# Patient Record
Sex: Female | Born: 1935 | Race: White | Hispanic: Yes | Marital: Married | Smoking: Never smoker
Health system: Southern US, Community
[De-identification: ages and names within clinical notes are randomized; demographics above are authoritative.]

## PROBLEM LIST (undated history)

## (undated) DIAGNOSIS — I10 Essential (primary) hypertension: Secondary | ICD-10-CM

## (undated) DIAGNOSIS — R7303 Prediabetes: Secondary | ICD-10-CM

## (undated) DIAGNOSIS — K219 Gastro-esophageal reflux disease without esophagitis: Secondary | ICD-10-CM

## (undated) HISTORY — DX: Essential (primary) hypertension: I10

## (undated) HISTORY — DX: Prediabetes: R73.03

## (undated) HISTORY — DX: Gastro-esophageal reflux disease without esophagitis: K21.9

---

## 2002-11-02 ENCOUNTER — Emergency Department (HOSPITAL_COMMUNITY): Admission: EM | Admit: 2002-11-02 | Discharge: 2002-11-03 | Payer: Self-pay | Admitting: Emergency Medicine

## 2002-11-03 ENCOUNTER — Encounter: Payer: Self-pay | Admitting: Emergency Medicine

## 2004-04-10 ENCOUNTER — Ambulatory Visit: Payer: Self-pay | Admitting: Nurse Practitioner

## 2004-04-24 ENCOUNTER — Ambulatory Visit: Payer: Self-pay | Admitting: Nurse Practitioner

## 2004-05-02 ENCOUNTER — Ambulatory Visit: Payer: Self-pay | Admitting: *Deleted

## 2004-05-22 ENCOUNTER — Ambulatory Visit: Payer: Self-pay | Admitting: Nurse Practitioner

## 2004-06-26 ENCOUNTER — Ambulatory Visit: Payer: Self-pay | Admitting: Nurse Practitioner

## 2004-09-03 ENCOUNTER — Ambulatory Visit: Payer: Self-pay | Admitting: Nurse Practitioner

## 2005-01-31 ENCOUNTER — Ambulatory Visit: Payer: Self-pay | Admitting: Nurse Practitioner

## 2005-02-14 ENCOUNTER — Ambulatory Visit: Payer: Self-pay | Admitting: Nurse Practitioner

## 2005-06-18 ENCOUNTER — Ambulatory Visit: Payer: Self-pay | Admitting: Nurse Practitioner

## 2005-08-21 ENCOUNTER — Ambulatory Visit: Payer: Self-pay | Admitting: Nurse Practitioner

## 2005-08-23 ENCOUNTER — Ambulatory Visit (HOSPITAL_COMMUNITY): Admission: RE | Admit: 2005-08-23 | Discharge: 2005-08-23 | Payer: Self-pay | Admitting: Internal Medicine

## 2005-11-08 ENCOUNTER — Ambulatory Visit: Payer: Self-pay | Admitting: Nurse Practitioner

## 2005-12-13 ENCOUNTER — Ambulatory Visit: Payer: Self-pay | Admitting: Nurse Practitioner

## 2005-12-14 ENCOUNTER — Ambulatory Visit (HOSPITAL_COMMUNITY): Admission: RE | Admit: 2005-12-14 | Discharge: 2005-12-14 | Payer: Self-pay | Admitting: Internal Medicine

## 2006-03-21 ENCOUNTER — Ambulatory Visit: Payer: Self-pay | Admitting: Nurse Practitioner

## 2006-06-05 ENCOUNTER — Ambulatory Visit: Payer: Self-pay | Admitting: Nurse Practitioner

## 2006-07-14 IMAGING — CT CT HEAD WO/W CM
1 of 2 series · 13 of 30 positions shown, 17 images · IV contrast (omnipaque)
Comparison: None.

CLINICAL DATA: Right-sided headache. Increasingly blurred vision.

HEAD CT WITHOUT AND WITH CONTRAST
TECHNIQUE: 5mm collimated images were obtained from the base of the skull
through the vertex according to standard protocol before and after
administration of intravenous contrast.
Contrast:  100 cc Omnipaque 300

[Series 2: brain w/o 4.8 h45s st · axial · non-contrast · 0.42mm/px · z∈[-155,-39]mm · 13 of 30 slices shown, 17 images]
[im 3/30  brain]
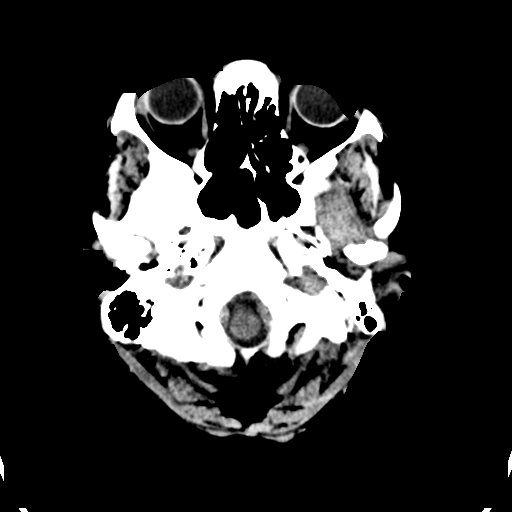
[im 3/30  bone]
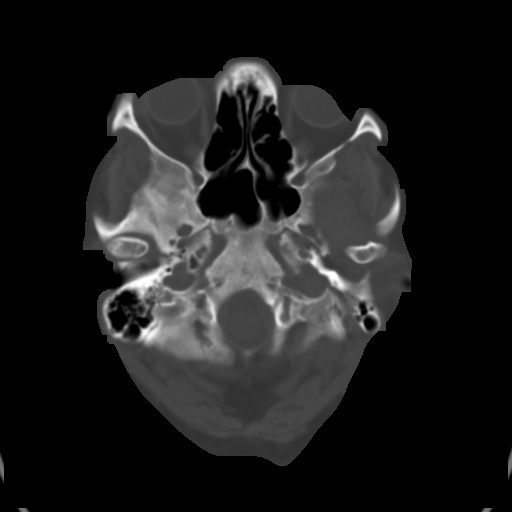
[im 5/30  brain]
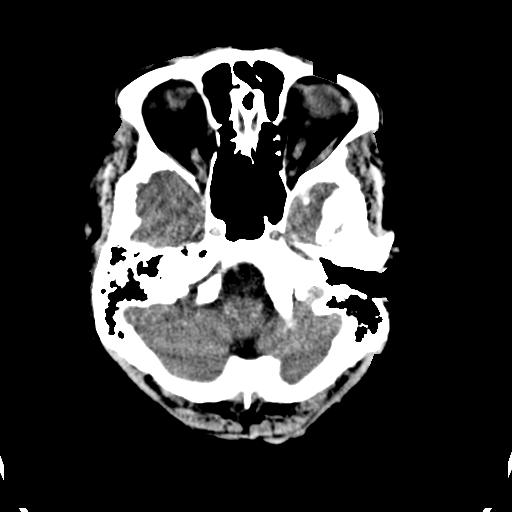
[im 7/30  brain]
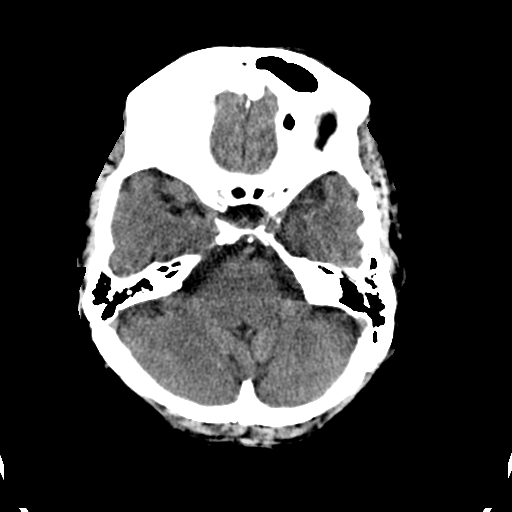
[im 9/30  brain]
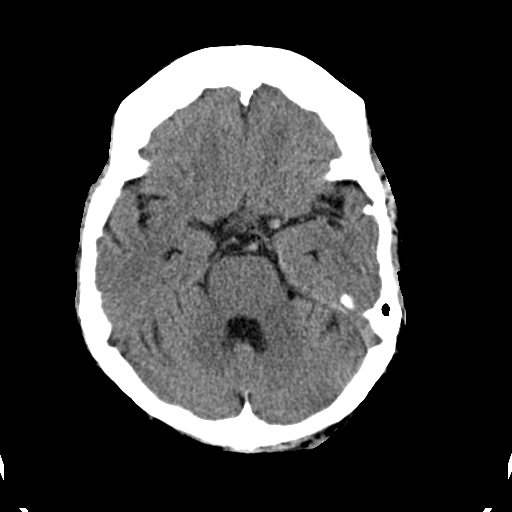
[im 11/30  brain]
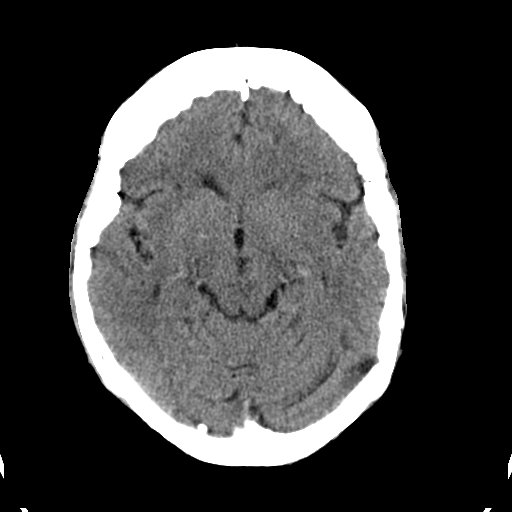
[im 11/30  bone]
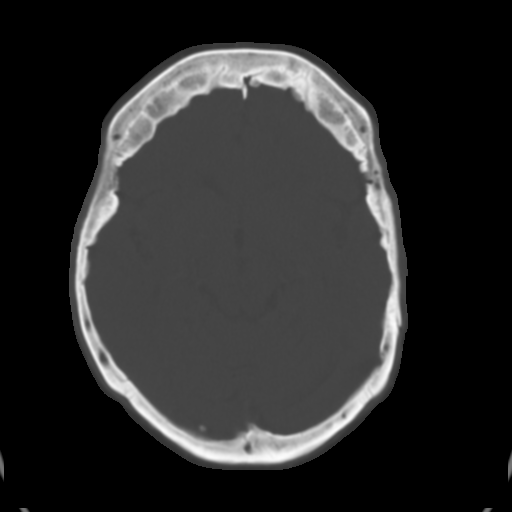
[im 13/30  brain]
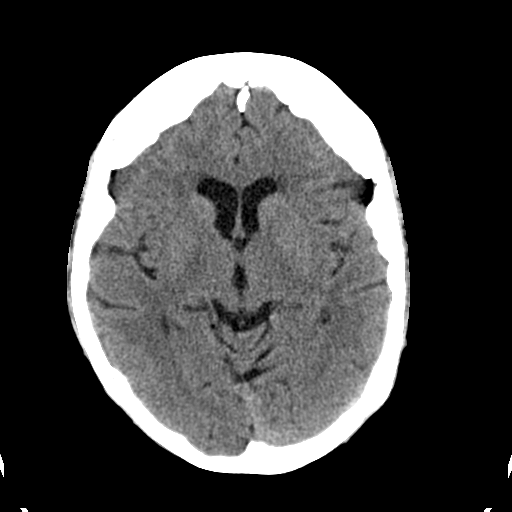
[im 15/30  brain]
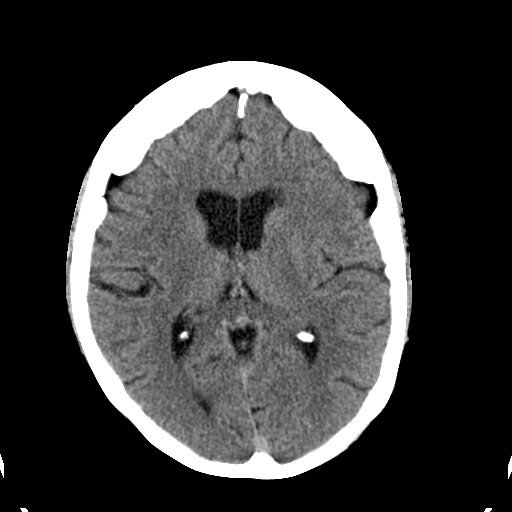
[im 17/30  brain]
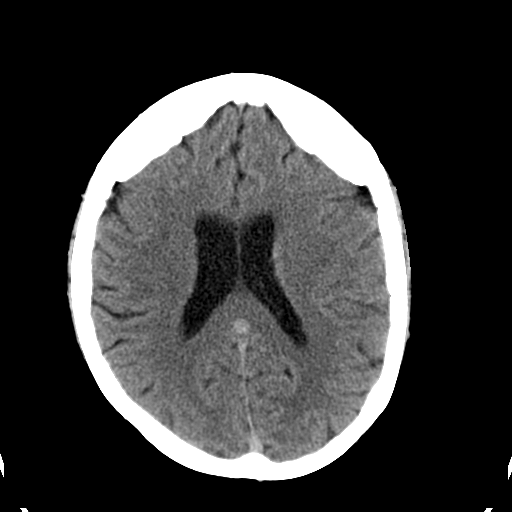
[im 19/30  brain]
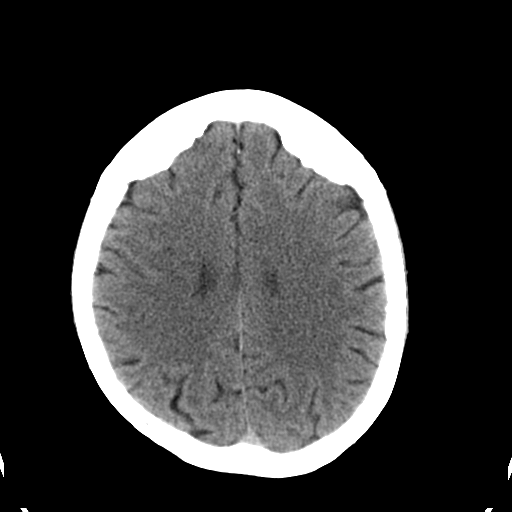
[im 19/30  bone]
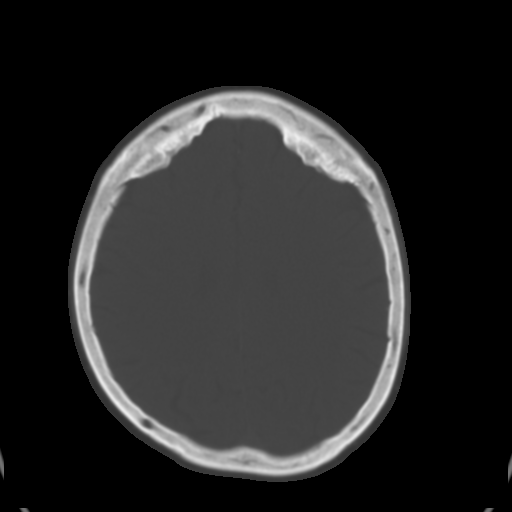
[im 21/30  brain]
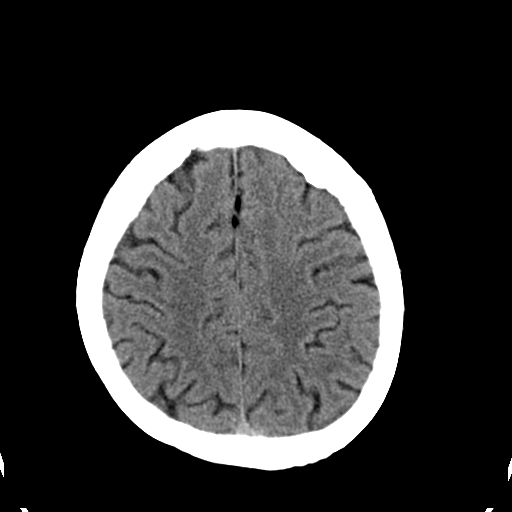
[im 23/30  brain]
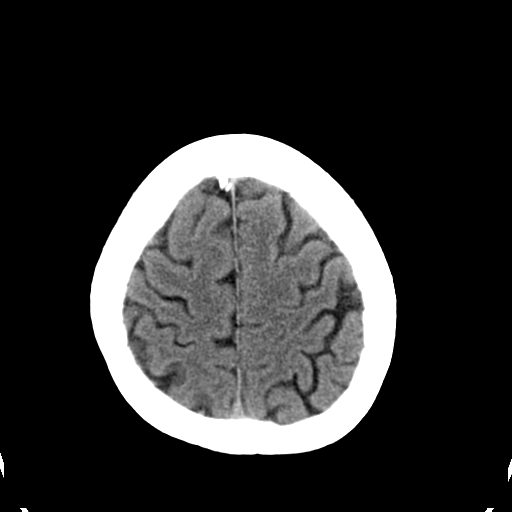
[im 25/30  brain]
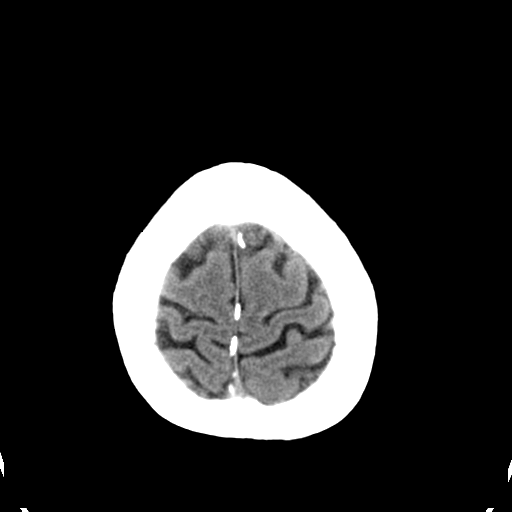
[im 27/30  brain]
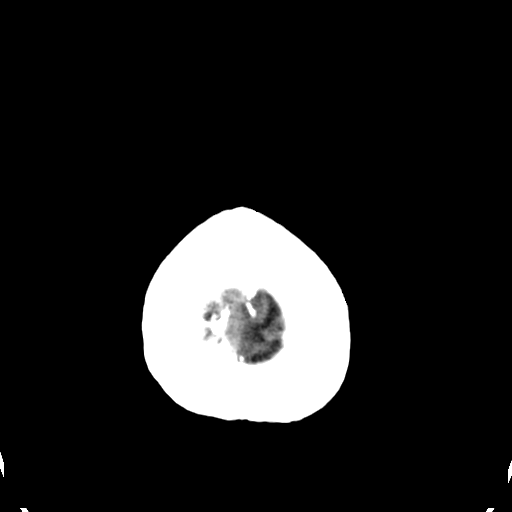
[im 27/30  bone]
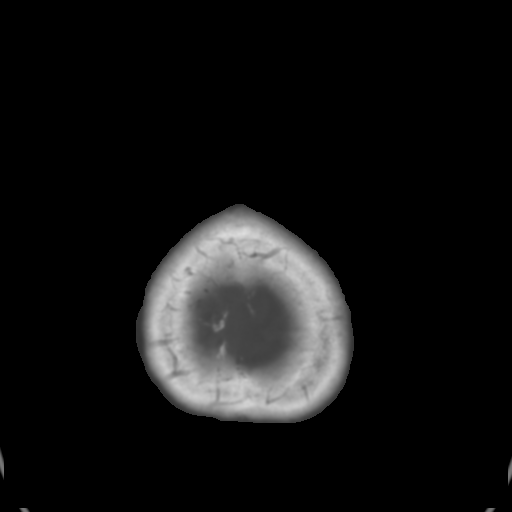

[13 of 30 positions shown; findings below may reference images not displayed]

FINDINGS: Minimally prominent ventricles and subarachnoid spaces. No
intracranial hemorrhage, mass-effect or abnormal enhancement. Bilateral
hyperostosis frontalis. The included portions of the paranasal sinuses are
normally pneumatized with a hypoplastic right frontal sinus noted.

IMPRESSION

Minimal diffuse cerebral and cerebellar atrophy. No acute abnormality.

## 2006-07-29 HISTORY — PX: CATARACT EXTRACTION: SUR2

## 2007-03-02 DIAGNOSIS — K219 Gastro-esophageal reflux disease without esophagitis: Secondary | ICD-10-CM

## 2007-03-02 DIAGNOSIS — I10 Essential (primary) hypertension: Secondary | ICD-10-CM

## 2007-03-25 ENCOUNTER — Encounter (INDEPENDENT_AMBULATORY_CARE_PROVIDER_SITE_OTHER): Payer: Self-pay | Admitting: Nurse Practitioner

## 2007-03-25 ENCOUNTER — Ambulatory Visit: Payer: Self-pay | Admitting: Internal Medicine

## 2007-03-25 LAB — CONVERTED CEMR LAB
AST: 34 units/L (ref 0–37)
Albumin: 4.2 g/dL (ref 3.5–5.2)
BUN: 15 mg/dL (ref 6–23)
Basophils Relative: 1 % (ref 0–1)
CO2: 27 meq/L (ref 19–32)
Calcium: 9.3 mg/dL (ref 8.4–10.5)
Chloride: 104 meq/L (ref 96–112)
Creatinine, Ser: 0.81 mg/dL (ref 0.40–1.20)
Glucose, Bld: 115 mg/dL — ABNORMAL HIGH (ref 70–99)
Hemoglobin: 13.5 g/dL (ref 12.0–15.0)
Lymphs Abs: 2.2 10*3/uL (ref 0.7–3.3)
MCHC: 32.6 g/dL (ref 30.0–36.0)
MCV: 94.5 fL (ref 78.0–100.0)
Monocytes Absolute: 0.5 10*3/uL (ref 0.2–0.7)
Monocytes Relative: 8 % (ref 3–11)
Neutro Abs: 3.1 10*3/uL (ref 1.7–7.7)
Neutrophils Relative %: 53 % (ref 43–77)
Potassium: 4.4 meq/L (ref 3.5–5.3)
RBC: 4.38 M/uL (ref 3.87–5.11)
TSH: 2.491 microintl units/mL (ref 0.350–5.50)
WBC: 5.9 10*3/uL (ref 4.0–10.5)

## 2007-04-07 ENCOUNTER — Ambulatory Visit (HOSPITAL_COMMUNITY): Admission: RE | Admit: 2007-04-07 | Discharge: 2007-04-07 | Payer: Self-pay | Admitting: Family Medicine

## 2007-04-15 ENCOUNTER — Encounter (INDEPENDENT_AMBULATORY_CARE_PROVIDER_SITE_OTHER): Payer: Self-pay | Admitting: *Deleted

## 2007-08-04 ENCOUNTER — Ambulatory Visit: Payer: Self-pay | Admitting: Internal Medicine

## 2008-04-25 ENCOUNTER — Ambulatory Visit: Payer: Self-pay | Admitting: Family Medicine

## 2008-05-03 ENCOUNTER — Ambulatory Visit: Payer: Self-pay | Admitting: Family Medicine

## 2008-05-03 LAB — CONVERTED CEMR LAB
Albumin: 4.3 g/dL (ref 3.5–5.2)
Alkaline Phosphatase: 135 units/L — ABNORMAL HIGH (ref 39–117)
BUN: 23 mg/dL (ref 6–23)
Basophils Relative: 1 % (ref 0–1)
Calcium: 9.7 mg/dL (ref 8.4–10.5)
Chloride: 103 meq/L (ref 96–112)
Glucose, Bld: 111 mg/dL — ABNORMAL HIGH (ref 70–99)
HDL: 53 mg/dL (ref >39)
Hemoglobin: 14.4 g/dL (ref 12.0–15.0)
LDL Cholesterol: 86 mg/dL (ref 0–99)
Lymphocytes Relative: 35 % (ref 12–46)
Lymphs Abs: 2 10*3/uL (ref 0.7–4.0)
MCHC: 32.4 g/dL (ref 30.0–36.0)
Monocytes Absolute: 0.4 10*3/uL (ref 0.1–1.0)
Monocytes Relative: 7 % (ref 3–12)
Neutro Abs: 3.2 10*3/uL (ref 1.7–7.7)
Neutrophils Relative %: 56 % (ref 43–77)
Potassium: 4.1 meq/L (ref 3.5–5.3)
RBC: 4.78 M/uL (ref 3.87–5.11)
Sodium: 142 meq/L (ref 135–145)
TSH: 1.874 microintl units/mL (ref 0.350–4.50)
Total Protein: 7.4 g/dL (ref 6.0–8.3)
Triglycerides: 146 mg/dL (ref <150)
WBC: 5.8 10*3/uL (ref 4.0–10.5)

## 2008-05-09 ENCOUNTER — Ambulatory Visit: Payer: Self-pay | Admitting: Internal Medicine

## 2009-01-14 ENCOUNTER — Emergency Department (HOSPITAL_COMMUNITY): Admission: EM | Admit: 2009-01-14 | Discharge: 2009-01-14 | Payer: Self-pay | Admitting: Emergency Medicine

## 2009-05-19 ENCOUNTER — Ambulatory Visit: Payer: Self-pay | Admitting: Internal Medicine

## 2009-06-01 ENCOUNTER — Ambulatory Visit: Payer: Self-pay | Admitting: Internal Medicine

## 2009-06-13 ENCOUNTER — Ambulatory Visit: Payer: Self-pay | Admitting: Internal Medicine

## 2009-06-13 ENCOUNTER — Encounter: Payer: Self-pay | Admitting: Family Medicine

## 2009-06-13 LAB — CONVERTED CEMR LAB
ALT: 24 units/L (ref 0–35)
Albumin: 4.3 g/dL (ref 3.5–5.2)
CO2: 27 meq/L (ref 19–32)
Calcium: 9.2 mg/dL (ref 8.4–10.5)
Chloride: 105 meq/L (ref 96–112)
Cholesterol: 169 mg/dL (ref 0–200)
Glucose, Bld: 90 mg/dL (ref 70–99)
Hemoglobin: 14 g/dL (ref 12.0–15.0)
Lymphs Abs: 2.1 10*3/uL (ref 0.7–4.0)
MCV: 95 fL (ref 78.0–100.0)
Monocytes Absolute: 0.5 10*3/uL (ref 0.1–1.0)
Monocytes Relative: 7 % (ref 3–12)
Neutro Abs: 3.6 10*3/uL (ref 1.7–7.7)
Neutrophils Relative %: 57 % (ref 43–77)
RBC: 4.59 M/uL (ref 3.87–5.11)
Sodium: 145 meq/L (ref 135–145)
Total Protein: 7.2 g/dL (ref 6.0–8.3)
VLDL: 23 mg/dL (ref 0–40)
WBC: 6.2 10*3/uL (ref 4.0–10.5)

## 2009-06-19 ENCOUNTER — Ambulatory Visit: Payer: Self-pay | Admitting: Internal Medicine

## 2009-08-18 ENCOUNTER — Ambulatory Visit: Payer: Self-pay | Admitting: Family Medicine

## 2009-09-07 ENCOUNTER — Ambulatory Visit: Payer: Self-pay | Admitting: Internal Medicine

## 2009-10-16 ENCOUNTER — Ambulatory Visit: Payer: Self-pay | Admitting: Internal Medicine

## 2009-10-16 LAB — CONVERTED CEMR LAB
Hgb A1c MFr Bld: 6.3 % — ABNORMAL HIGH (ref 4.6–6.1)
TSH: 1.382 microintl units/mL (ref 0.350–4.500)

## 2009-11-06 ENCOUNTER — Ambulatory Visit: Payer: Self-pay | Admitting: Internal Medicine

## 2009-11-06 LAB — CONVERTED CEMR LAB
Chloride: 104 meq/L (ref 96–112)
Creatinine, Ser: 0.8 mg/dL (ref 0.40–1.20)
Potassium: 4 meq/L (ref 3.5–5.3)
Sodium: 142 meq/L (ref 135–145)

## 2009-12-15 ENCOUNTER — Ambulatory Visit: Payer: Self-pay | Admitting: Internal Medicine

## 2010-02-13 ENCOUNTER — Ambulatory Visit: Payer: Self-pay | Admitting: Internal Medicine

## 2010-02-13 LAB — CONVERTED CEMR LAB
BUN: 24 mg/dL — ABNORMAL HIGH (ref 6–23)
Chloride: 104 meq/L (ref 96–112)
HDL: 51 mg/dL (ref >39)
LDL Cholesterol: 91 mg/dL (ref 0–99)
Potassium: 4.9 meq/L (ref 3.5–5.3)
Sodium: 142 meq/L (ref 135–145)
Total CHOL/HDL Ratio: 3.3
Triglycerides: 132 mg/dL (ref <150)
VLDL: 26 mg/dL (ref 0–40)

## 2010-03-08 ENCOUNTER — Ambulatory Visit: Payer: Self-pay | Admitting: Internal Medicine

## 2010-06-11 ENCOUNTER — Encounter (INDEPENDENT_AMBULATORY_CARE_PROVIDER_SITE_OTHER): Payer: Self-pay | Admitting: *Deleted

## 2010-06-11 ENCOUNTER — Ambulatory Visit: Payer: Self-pay | Admitting: Internal Medicine

## 2010-06-11 LAB — CONVERTED CEMR LAB
BUN: 23 mg/dL (ref 6–23)
Calcium: 9.5 mg/dL (ref 8.4–10.5)
Creatinine, Ser: 0.93 mg/dL (ref 0.40–1.20)
Hgb A1c MFr Bld: 6.1 % — ABNORMAL HIGH (ref <5.7)
Magnesium: 2.2 mg/dL (ref 1.5–2.5)

## 2011-07-30 DIAGNOSIS — R7303 Prediabetes: Secondary | ICD-10-CM | POA: Insufficient documentation

## 2011-07-30 HISTORY — DX: Prediabetes: R73.03

## 2013-10-12 ENCOUNTER — Ambulatory Visit: Payer: Self-pay | Attending: Internal Medicine | Admitting: Internal Medicine

## 2013-10-12 ENCOUNTER — Encounter: Payer: Self-pay | Admitting: Internal Medicine

## 2013-10-12 VITALS — BP 160/90 | HR 78 | Temp 98.3°F | Resp 16 | Wt 163.8 lb

## 2013-10-12 DIAGNOSIS — Z008 Encounter for other general examination: Secondary | ICD-10-CM | POA: Insufficient documentation

## 2013-10-12 DIAGNOSIS — Z139 Encounter for screening, unspecified: Secondary | ICD-10-CM

## 2013-10-12 DIAGNOSIS — R208 Other disturbances of skin sensation: Secondary | ICD-10-CM | POA: Insufficient documentation

## 2013-10-12 DIAGNOSIS — R209 Unspecified disturbances of skin sensation: Secondary | ICD-10-CM | POA: Insufficient documentation

## 2013-10-12 DIAGNOSIS — M25569 Pain in unspecified knee: Secondary | ICD-10-CM | POA: Insufficient documentation

## 2013-10-12 DIAGNOSIS — M25562 Pain in left knee: Secondary | ICD-10-CM

## 2013-10-12 DIAGNOSIS — Z79899 Other long term (current) drug therapy: Secondary | ICD-10-CM | POA: Insufficient documentation

## 2013-10-12 DIAGNOSIS — I10 Essential (primary) hypertension: Secondary | ICD-10-CM | POA: Insufficient documentation

## 2013-10-12 DIAGNOSIS — K219 Gastro-esophageal reflux disease without esophagitis: Secondary | ICD-10-CM | POA: Insufficient documentation

## 2013-10-12 DIAGNOSIS — M25561 Pain in right knee: Secondary | ICD-10-CM | POA: Insufficient documentation

## 2013-10-12 LAB — CBC WITH DIFFERENTIAL/PLATELET
BASOS ABS: 0.1 10*3/uL (ref 0.0–0.1)
Basophils Relative: 1 % (ref 0–1)
EOS PCT: 1 % (ref 0–5)
Eosinophils Absolute: 0.1 10*3/uL (ref 0.0–0.7)
HCT: 40.5 % (ref 36.0–46.0)
Hemoglobin: 13.6 g/dL (ref 12.0–15.0)
LYMPHS PCT: 25 % (ref 12–46)
Lymphs Abs: 1.3 10*3/uL (ref 0.7–4.0)
MCH: 30.1 pg (ref 26.0–34.0)
MCHC: 33.6 g/dL (ref 30.0–36.0)
MCV: 89.6 fL (ref 78.0–100.0)
Monocytes Absolute: 0.4 10*3/uL (ref 0.1–1.0)
Monocytes Relative: 7 % (ref 3–12)
Neutro Abs: 3.5 10*3/uL (ref 1.7–7.7)
Neutrophils Relative %: 66 % (ref 43–77)
PLATELETS: 211 10*3/uL (ref 150–400)
RBC: 4.52 MIL/uL (ref 3.87–5.11)
RDW: 14.6 % (ref 11.5–15.5)
WBC: 5.3 10*3/uL (ref 4.0–10.5)

## 2013-10-12 LAB — COMPLETE METABOLIC PANEL WITH GFR
ALBUMIN: 3.8 g/dL (ref 3.5–5.2)
ALT: 19 U/L (ref 0–35)
AST: 28 U/L (ref 0–37)
Alkaline Phosphatase: 95 U/L (ref 39–117)
BUN: 22 mg/dL (ref 6–23)
CALCIUM: 9.3 mg/dL (ref 8.4–10.5)
CHLORIDE: 102 meq/L (ref 96–112)
CO2: 27 meq/L (ref 19–32)
Creat: 1.04 mg/dL (ref 0.50–1.10)
GFR, EST AFRICAN AMERICAN: 57 mL/min — AB
GFR, EST NON AFRICAN AMERICAN: 50 mL/min — AB
GLUCOSE: 134 mg/dL — AB (ref 70–99)
POTASSIUM: 4.3 meq/L (ref 3.5–5.3)
Sodium: 139 mEq/L (ref 135–145)
Total Bilirubin: 0.8 mg/dL (ref 0.2–1.2)
Total Protein: 6.8 g/dL (ref 6.0–8.3)

## 2013-10-12 MED ORDER — AMLODIPINE BESYLATE 2.5 MG PO TABS: 2.5000 mg | ORAL_TABLET | Freq: Every day | ORAL | Status: DC

## 2013-10-12 MED ORDER — GABAPENTIN 100 MG PO CAPS: 100.0000 mg | ORAL_CAPSULE | Freq: Every day | ORAL | Status: DC

## 2013-10-12 NOTE — Progress Notes (Signed)
Patient here to establish care Takes medication for blood pressure 

## 2013-10-12 NOTE — Progress Notes (Signed)
Patient Demographics  Holly Connersueblito Flaming, is a 80 y.o. female  ZOX:[id number]  [id number]  DOB - [birth year]  CC:  Chief Complaint  Patient presents with  . Establish Care       HPI: Holly Anderson is a 80 y.o. female here today to establish medical care. Patient has history of hypertension, GERD, chronic bilateral knee pain she is accompanied with her family members, as per patient she is compliant taking her medication today her blood pressure is elevated to 160/90 manually , she also reported to have feeling numbness and burning sensation in the feet especially worse at night, for the knee pain she has been taking naproxen. Patient has No headache, No chest pain, No abdominal pain - No Nausea, No new weakness tingling or numbness, No Cough - SOB.  No Known Allergies Past Medical History  Diagnosis Date  . Hypertension   . GERD (gastroesophageal reflux disease)    No current outpatient prescriptions on file prior to visit.   No current facility-administered medications on file prior to visit.   Family History  Problem Relation Age of Onset  . Hypertension Mother    History   Social History  . Marital Status: Married    Spouse Name: N/A    Number of Children: N/A  . Years of Education: N/A   Occupational History  . Not on file.   Social History Main Topics  . Smoking status: Never Smoker   . Smokeless tobacco: Not on file  . Alcohol Use: No  . Drug Use: Not on file  . Sexual Activity: Not on file   Other Topics Concern  . Not on file   Social History Narrative  . No narrative on file    Review of Systems: Constitutional: Negative for fever, chills, diaphoresis, activity change, appetite change and fatigue. HENT: Negative for ear pain, nosebleeds, congestion, facial swelling, rhinorrhea, neck pain, neck stiffness and ear discharge.  Eyes: Negative for pain, discharge, redness, itching and visual disturbance. Respiratory: Negative for cough, choking,  chest tightness, shortness of breath, wheezing and stridor.  Cardiovascular: Negative for chest pain, palpitations and leg swelling. Gastrointestinal: Negative for abdominal distention. Genitourinary: Negative for dysuria, urgency, frequency, hematuria, flank pain, decreased urine volume, difficulty urinating and dyspareunia.  Musculoskeletal: Negative for back pain, joint swelling, arthralgia and gait problem. Neurological: Negative for dizziness, tremors, seizures, syncope, facial asymmetry, speech difficulty, weakness, light-headedness, numbness and headaches.  Hematological: Negative for adenopathy. Does not bruise/bleed easily. Psychiatric/Behavioral: Negative for hallucinations, behavioral problems, confusion, dysphoric mood, decreased concentration and agitation.    Objective:   Filed Vitals:   10/12/13 1154  BP: 160/90  Pulse:   Temp:   Resp:     Physical Exam: Constitutional: Patient appears well-developed and well-nourished. No distress. HENT: Normocephalic, atraumatic, External right and left ear normal. Oropharynx is clear and moist.  Eyes: Conjunctivae and EOM are normal. PERRLA, no scleral icterus. Neck: Normal ROM. Neck supple. No JVD. No tracheal deviation. No thyromegaly. CVS: RRR, S1/S2 +, no murmurs, no gallops, no carotid bruit.  Pulmonary: Effort and breath sounds normal, no stridor, rhonchi, wheezes, rales.  Musculoskeletal: Normal range of motion. Right knee tenderness and crepitation positive  Neuro: Alert. Normal reflexes, muscle tone coordination. No cranial nerve deficit. Skin: Skin is warm and dry. No rash noted. Not diaphoretic. No erythema. No pallor. Psychiatric: Normal mood and affect. Behavior, judgment, thought content normal.  Lab Results  Component Value Date   WBC 6.2 06/13/2009   HGB 14.0  06/13/2009   HCT 43.6 06/13/2009   MCV 95.0 06/13/2009   PLT 233 06/13/2009   Lab Results  Component Value Date   CREATININE 0.93 06/11/2010   BUN 23  06/11/2010   NA 145 06/11/2010   K 5.0 06/11/2010   CL 106 06/11/2010   CO2 27 06/11/2010    Lab Results  Component Value Date   HGBA1C 6.1* 06/11/2010   Lipid Panel     Component Value Date/Time   CHOL 168 02/13/2010 2125   TRIG 132 02/13/2010 2125   HDL 51 02/13/2010 2125   CHOLHDL 3.3 Ratio 02/13/2010 2125   VLDL 26 02/13/2010 2125   LDLCALC 91 02/13/2010 2125       Assessment and plan:   1. HYPERTENSION Advised for DASH diet, continue with her lisinopril/hydrochlorothiazide, I have added Norvasc 2.5 mg. - COMPLETE METABOLIC PANEL WITH GFR - amLODipine (NORVASC) 2.5 MG tablet; Take 1 tablet (2.5 mg total) by mouth daily.  Dispense: 90 tablet; Refill: 3  2. GERD Continue with Prilosec.  3. Screening Baseline blood work - CBC with Differential - Vit D  25 hydroxy (rtn osteoporosis monitoring) - TSH  5. Bilateral knee pain Taking naproxen when necessary for pain. - Ambulatory referral to Orthopedic Surgery  6. Burning sensation of feet Patient will try Neurontin each bedtime - gabapentin (NEURONTIN) 100 MG capsule; Take 1 capsule (100 mg total) by mouth at bedtime.  Dispense: 90 capsule; Refill: 1 - Will check Vitamin B12   Return in about 6 weeks (around 11/23/2013).   Doris Cheadle, MD

## 2013-10-12 NOTE — Patient Instructions (Signed)
La dieta DASH  (DASH Diet) La dieta DASH (por su nombre en ingls) significa "Abordaje diettico para detener la hipertensin". Es un plan de alimentacin saludable que ha demostrado reducir la presin arterial elevada (hipertensin) en tan solo 14 das, mientras probablemente tambin ofrezca otros beneficios significativos para la salud. Estos otros beneficios incluyen la reduccin del riesgo de sufrir cncer de mama despus de la menopausia y de sufrir diabetes tipo 2, enfermedades cardacas, cncer de colon e ictus. Los beneficios para la salud tambin incluyen la prdida de peso y la disminucin del riesgo de insuficiencia renal en pacientes con enfermedad renal crnica.  GUAS PARA LA DIETA   Limite la cantidad de sal (sodio). Su dieta debe contener menos de 1500 mg de sodio por da.  Limite el consumo de carbohidratos refinados o procesados. Su dieta debe incluir principalmente granos enteros. Consuma postres con moderacin y limite el agregado de azcar.  Incluya pequeas cantidades de grasas saludables para el corazn. Estos tipos de grasas estn contenidas en las nueces, aceites y margarina. Limite las grasas saturadas y trans. Estas grasas han demostrado ser perjudiciales para el organismo. ELECCIN DE LOS ALIMENTOS  Los siguientes grupos de alimentos se basan en una dieta de 2000 caloras. Consulte a su nutricionista matriculado cules son sus necesidades calricas individuales.  Granos y productos elaborados con granos (6 a 8 porciones por da).   Consuma ms a menudo: Pan integral, arroz integral, pasta de trigo o de harina integral, quinoa, palomitas de maz sin grasa o sal aadida (infladas).  Consuma con menos frecuencia:  Pan blanco, pastas blancas, arroz blanco, pan de maz. Verduras (4 a 5 porciones por da).   Consuma ms a menudo: Hortalizas frescas, congeladas y enlatadas. Puede consumir las hortalizas crudas, al vapor, asadas o grilladas con una mnima cantidad de  grasas.  Consuma con menos frecuencia o evite: Vegetales con crema o fritos. Verduras en salsa de queso. Frutas (4 a 5 porciones por da).  Consuma ms a menudo: Frutas frescas, en conserva (en su jugo natural) o frutas congeladas. Frutas secas sin el agregado de azcar. Cien por ciento jugo de fruta ( taza [237 ml] por da).  Consuma con menos frecuencia: Frutas secas con azcar agregado. Fruta enlatada en almbar light o espeso. Carnes magras, pescado y aves de corral (2 porciones o menos por da. Una porcin es de 3 a 4 oz. [85-114 g]).  Consuma ms a menudo: Carne molida 90% magra o ms de lomo o solomillo . Cortes redondos de carne, pechuga de pollo y de pavo. Todos los pescados. Prepare la carne al horno o asada a la parrilla o al grill. Nada debe ser frito.  Consuma con menos frecuencia o evite: Cortes grasos de carne, pavo o pata, muslo o ala de pollo. Cortes de carne o pescado fritos. Lcteos (2 a 3 porciones)  Consuma ms a menudo: La leche descremada o con bajo contenido de grasa, yogur descremado o semidescremado, queso con bajo contenido en grasa o parcialmente descremado.  Consuma con menos frecuencia o evite: Leche (entera, al 2%). Yogur de leche entera. Quesos con toda su grasa. Nueces, semillas y legumbres (4 a 5 porciones por semana).  Consuma ms a menudo: Todos los alimentos sin sal agregada.  Consuma con menos frecuencia o evite: Nueces y semillas con sal, frijoles enlatados con sal agregada. Grasas y dulces (limitados)  Consuma ms a menudo: Aceites vegetales, margarinas en pote sin grasas trans, gelatina sin azcar. Mayonesa y aderezos para ensaladas.  Consuma   con menos frecuencia o evite: Aceites de coco, aceites de palma, mantequilla, margarina en barra, crema, mitad leche y mitad crema, galletas, dulces, pastel. PARA OBTENER MS INFORMACIN  El Dash Diet Eating Plan (Plan Nutricional Dieta Dash): www.dashdiet.org  Document Released: 07/04/2011 Document  Revised: 10/07/2011 ExitCare Patient Information 2014 [employer].  

## 2013-10-13 LAB — VITAMIN D 25 HYDROXY (VIT D DEFICIENCY, FRACTURES): Vit D, 25-Hydroxy: 30 ng/mL (ref 30–89)

## 2013-10-13 LAB — VITAMIN B12: VITAMIN B 12: 536 pg/mL (ref 211–911)

## 2013-10-13 LAB — TSH: TSH: 2.035 u[IU]/mL (ref 0.350–4.500)

## 2013-10-14 ENCOUNTER — Telehealth: Payer: Self-pay | Admitting: Emergency Medicine

## 2013-10-14 ENCOUNTER — Telehealth: Payer: Self-pay | Admitting: *Deleted

## 2013-10-14 NOTE — Telephone Encounter (Signed)
Message copied by SMITH, JILL Darlis Loan on Thu Oct 14, 2013  4:09 PM ------      Message from: Doris CheadleADVANI, DEEPAK      Created: Wed Oct 13, 2013 12:46 PM       Blood work reviewed noticed impaired fasting glucose, call and advise patient for low carbohydrate diet. Will check hemoglobin A1c on the next visit.       ------

## 2013-10-14 NOTE — Telephone Encounter (Signed)
Message copied by Raynelle CharyWINFREE, Sarie Stall R on Thu Oct 14, 2013  2:12 PM ------      Message from: Doris CheadleADVANI, DEEPAK      Created: Wed Oct 13, 2013 12:46 PM       Blood work reviewed noticed impaired fasting glucose, call and advise patient for low carbohydrate diet. Will check hemoglobin A1c on the next visit.       ------

## 2013-10-14 NOTE — Telephone Encounter (Signed)
Pt given lab results via [employer] spanish language Pt verbalized understanding

## 2013-10-14 NOTE — Telephone Encounter (Signed)
Left message 1st attempt.  

## 2013-10-28 ENCOUNTER — Ambulatory Visit: Payer: Self-pay

## 2013-11-01 ENCOUNTER — Ambulatory Visit: Payer: Self-pay | Admitting: Family Medicine

## 2013-11-19 ENCOUNTER — Ambulatory Visit: Payer: Self-pay | Admitting: Internal Medicine

## 2015-07-17 ENCOUNTER — Encounter: Payer: Self-pay | Admitting: Internal Medicine

## 2015-12-21 ENCOUNTER — Other Ambulatory Visit: Payer: Self-pay | Admitting: Internal Medicine

## 2016-01-23 ENCOUNTER — Other Ambulatory Visit: Payer: Self-pay | Admitting: Internal Medicine

## 2016-03-13 ENCOUNTER — Other Ambulatory Visit: Payer: Self-pay | Admitting: Internal Medicine

## 2016-03-14 ENCOUNTER — Telehealth: Payer: Self-pay | Admitting: Internal Medicine

## 2016-03-14 MED ORDER — LISINOPRIL-HYDROCHLOROTHIAZIDE 20-25 MG PO TABS: 1.0000 | ORAL_TABLET | Freq: Every day | ORAL | 1 refills | Status: DC

## 2016-03-14 MED ORDER — OMEPRAZOLE 40 MG PO CPDR
40.0000 mg | DELAYED_RELEASE_CAPSULE | Freq: Every day | ORAL | 11 refills | Status: DC
Start: 2016-03-14 — End: 2016-04-26

## 2016-03-14 MED ORDER — NAPROXEN 500 MG PO TABS: 500.0000 mg | ORAL_TABLET | Freq: Two times a day (BID) | ORAL | 11 refills | Status: DC

## 2016-03-14 NOTE — Telephone Encounter (Signed)
Patient came in to request medication refill for medications lisinopril-hydrochlorothiazide 20-25 MG per tablet , Omeprazole 40 MG capsule, naproxen  500 MG tablet. Please change the pharmacy in patient's chart [facility]  on [address], [place], [place] [address]  Phone: [phone]  Patient scheduled to come in for CPE 04/26/16 at 9:30 AM

## 2016-03-14 NOTE — Telephone Encounter (Signed)
Appointment for CPE scheduled and [facility] [address].

## 2016-03-14 NOTE — Telephone Encounter (Signed)
Patient had been out of her medications for a week now.

## 2016-04-26 ENCOUNTER — Ambulatory Visit (INDEPENDENT_AMBULATORY_CARE_PROVIDER_SITE_OTHER): Payer: Self-pay | Admitting: Internal Medicine

## 2016-04-26 ENCOUNTER — Encounter: Payer: Self-pay | Admitting: Internal Medicine

## 2016-04-26 VITALS — BP 164/70 | HR 72 | Temp 98.0°F | Resp 16 | Ht <= 58 in | Wt 151.0 lb

## 2016-04-26 DIAGNOSIS — I10 Essential (primary) hypertension: Secondary | ICD-10-CM

## 2016-04-26 DIAGNOSIS — R208 Other disturbances of skin sensation: Secondary | ICD-10-CM

## 2016-04-26 DIAGNOSIS — Z0000 Encounter for general adult medical examination without abnormal findings: Secondary | ICD-10-CM

## 2016-04-26 MED ORDER — OMEPRAZOLE 40 MG PO CPDR: 40.0000 mg | DELAYED_RELEASE_CAPSULE | Freq: Every day | ORAL | 11 refills | Status: DC

## 2016-04-26 MED ORDER — AMLODIPINE BESYLATE 2.5 MG PO TABS: 2.5000 mg | ORAL_TABLET | Freq: Every day | ORAL | 3 refills | Status: DC

## 2016-04-26 MED ORDER — LISINOPRIL-HYDROCHLOROTHIAZIDE 20-25 MG PO TABS
1.0000 | ORAL_TABLET | Freq: Every day | ORAL | 1 refills | Status: DC
Start: 2016-04-26 — End: 2016-04-26

## 2016-04-26 MED ORDER — LISINOPRIL-HYDROCHLOROTHIAZIDE 20-25 MG PO TABS: 1.0000 | ORAL_TABLET | Freq: Every day | ORAL | 11 refills | Status: DC

## 2016-04-26 MED ORDER — GABAPENTIN 100 MG PO CAPS: 100.0000 mg | ORAL_CAPSULE | Freq: Every day | ORAL | 11 refills | Status: DC

## 2016-04-26 MED ORDER — NAPROXEN 500 MG PO TABS: 500.0000 mg | ORAL_TABLET | Freq: Two times a day (BID) | ORAL | 11 refills | Status: DC

## 2016-04-26 MED ORDER — AMLODIPINE BESYLATE 2.5 MG PO TABS: 2.5000 mg | ORAL_TABLET | Freq: Every day | ORAL | 11 refills | Status: DC

## 2016-04-26 MED ORDER — GABAPENTIN 100 MG PO CAPS: 100.0000 mg | ORAL_CAPSULE | Freq: Every day | ORAL | 1 refills | Status: DC

## 2016-04-26 NOTE — Progress Notes (Signed)
Subjective:    Patient ID: Holly Anderson, female    DOB: [birth year], 82 y.o.   MRN: [id number]  HPI   CPE  Daughter interpreting today  1.  Pap:  Cannot remember when had last pap, but always normal in past.  2.  Mammogram:  Cannot recall if she has had a mammogram.  Not interested in having one done at this point in her life    3.  Osteoprevention: 2 cups 1-3 servings of dairy daily:  Milk, cheese, yogurt.  Does use  Stationary bike 30 minutes daily.  4.  Guaiac Cards:  Never  5.  Colonoscopy:  Never--not interested in having this done.  6.  Immunizations:  Immunization History  Administered Date(s) Administered  . Influenza Whole 06/05/2006   No flu vaccine yet this year as never had a pneumovax  Essential Hypertension:  Ran out of Amlodipine in March.  Still taking Lisinopril HCTZ. Current Meds  Medication Sig  . lisinopril-hydrochlorothiazide (PRINZIDE,ZESTORETIC) 20-25 MG tablet Take 1 tablet by mouth daily.  . naproxen (NAPROSYN) 500 MG tablet Take 1 tablet (500 mg total) by mouth 2 (two) times daily with a meal.  . omeprazole (PRILOSEC) 40 MG capsule Take 1 capsule (40 mg total) by mouth daily.   No Known Allergies   Past Medical History:  Diagnosis Date  . GERD (gastroesophageal reflux disease)   . Hypertension   . Prediabetes    Past Surgical History:  Procedure Laterality Date  . CATARACT EXTRACTION Right 2008   Family History  Problem Relation Age of Onset  . Hypertension Mother    Social History   Social History  . Marital status: Married    Spouse name: Alden BenjaminFernando Martiarena  . Number of children: 5  . Years of education: 1   Occupational History  . house cleaning in the past--retired    Social History Main Topics  . Smoking status: Never Smoker  . Smokeless tobacco: Never Used  . Alcohol use No  . Drug use: No  . Sexual activity: Not Currently   Other Topics Concern  . Not on file   Social History Narrative   Originally from [place]    [place]   Lives at home with her 2 daughters and one of daughter's grandsons   Her husband lives in [place]--he is 622 years old (2017)   .   Review of Systems  Constitutional: Negative for fatigue.  HENT: Positive for hearing loss.        Not interested in having evaluated--not interested in hearing aides  Respiratory: Negative for shortness of breath.   Cardiovascular: Positive for leg swelling. Negative for chest pain.       Legs are dependent throughout the day.    Genitourinary: Negative for dysuria and urgency.       1 coke every day--makes her urinate every day  Musculoskeletal: Positive for arthralgias.       Knees hurt and keep her up at night.  Not taking Gabapentin for that  Neurological:       Memory:  Forgets cooking utensils in the cooler.  Forgets she has things cooking on the stove.  Psychiatric/Behavioral: Negative for dysphoric mood. The patient is not nervous/anxious.        Objective:   Physical Exam  Constitutional: She is oriented to person, place, and time. She appears well-developed and well-nourished.  HENT:  Head: Normocephalic and atraumatic.  Right Ear: Hearing, tympanic membrane, external ear and ear canal normal.  Left Ear: Hearing, tympanic membrane, external ear and ear canal normal.  Nose: Nose normal.  Mouth/Throat: Uvula is midline, oropharynx is clear and moist and mucous membranes are normal. Abnormal dentition.  Teeth missing.  Receding gumline  Eyes: Conjunctivae and EOM are normal. Pupils are equal, round, and reactive to light.  Not able to see discs well  Neck: Normal range of motion. Neck supple. No thyromegaly present.  Cardiovascular: Normal rate, regular rhythm, S1 normal and S2 normal.  PMI is not displaced.  Exam reveals no S3, no S4 and no friction rub.   No murmur heard. No carotid bruits.  Carotid, radial, femoral, DP and PT pulses normal and equal.   Pulmonary/Chest: Effort normal and breath sounds normal. Right  breast exhibits no inverted nipple, no mass, no nipple discharge, no skin change and no tenderness. Left breast exhibits no inverted nipple, no mass, no nipple discharge, no skin change and no tenderness.  Abdominal: Soft. Bowel sounds are normal. She exhibits no mass. There is no hepatosplenomegaly. There is no tenderness. No hernia.  Genitourinary:  Genitourinary Comments: Patient requested no examination.  Musculoskeletal: Normal range of motion.  Lymphadenopathy:       Head (right side): No submental and no submandibular adenopathy present.       Head (left side): No submental and no submandibular adenopathy present.    She has no cervical adenopathy.    She has no axillary adenopathy.       Right: No inguinal and no supraclavicular adenopathy present.       Left: No inguinal and no supraclavicular adenopathy present.  Neurological: She is alert and oriented to person, place, and time. She has normal strength and normal reflexes. No cranial nerve deficit or sensory deficit. Coordination and gait normal.  Skin: Skin is warm and dry.  Psychiatric: She has a normal mood and affect. Her speech is normal and behavior is normal. Judgment and thought content normal.          Assessment & Plan:  1.  CPE [facility] as gratis  2.  Memory:  Will perform MMSE when follows up for hypertension after back on meds.  3.  Essential hypertension: Missing amlodipine for some time--restart. Will do fasting blood work when returns for BP, pneumovax as well.

## 2016-06-01 ENCOUNTER — Encounter: Payer: Self-pay | Admitting: Internal Medicine

## 2016-06-11 ENCOUNTER — Ambulatory Visit (INDEPENDENT_AMBULATORY_CARE_PROVIDER_SITE_OTHER): Payer: Self-pay | Admitting: Internal Medicine

## 2016-06-11 ENCOUNTER — Other Ambulatory Visit: Payer: Self-pay | Admitting: Internal Medicine

## 2016-06-11 ENCOUNTER — Encounter: Payer: Self-pay | Admitting: Internal Medicine

## 2016-06-11 VITALS — BP 160/82 | HR 74 | Resp 12 | Ht <= 58 in | Wt 147.0 lb

## 2016-06-11 DIAGNOSIS — R7303 Prediabetes: Secondary | ICD-10-CM

## 2016-06-11 DIAGNOSIS — F0390 Unspecified dementia without behavioral disturbance: Secondary | ICD-10-CM

## 2016-06-11 DIAGNOSIS — Z13220 Encounter for screening for lipoid disorders: Secondary | ICD-10-CM

## 2016-06-11 DIAGNOSIS — I10 Essential (primary) hypertension: Secondary | ICD-10-CM

## 2016-06-11 MED ORDER — DONEPEZIL HCL 5 MG PO TABS: 5.0000 mg | ORAL_TABLET | Freq: Every day | ORAL | 11 refills | Status: DC

## 2016-06-11 NOTE — Progress Notes (Signed)
Subjective:    Patient ID: Holly Anderson, female    DOB: Nov 15, 1927, 82 y.o.   MRN: [id number]  HPI  1.  Essential Hypertension:  Meds all filled 04/26/2016.  Amlodipine had reportedly not been filled since March, so should not have had any extra around at home.  Has at least 21 tabs from 04/26/2016 left in bottle, so in past 6 weeks, has only taken 9 days worth of medication.    2.  Concern for memory:  Patient recognizes loss of memory.  States maybe 2 years has noted a loss of memory.  Daughter states she has noted problems with memory for 1 year, but has also noted in past 6 months that her mother is hearing things--voices or screaming at the window, knocking at door.  Generally occurs at night when she and daughter are in bed or when she is alone during the day.  No visual hallucinations. Patient has also lost her hearing.  Family feels her difficulty hearing things and "fills in"  Information when she cannot hear adequately.  Was evaluated 2-3 years ago, but could not afford hearing aides. Daughter feels her mother's memory loss has been gradual, patient feels the same. Daughter states she has left things cooking on stove until burned.  Patient states forgets her prayers and has to start over. Appears she is forgetting meds frequently as noted above. She states she forgets why she is getting items. [facility] and walk back without feeling lost. No urinary incontinence other then urge incontinence, but she knows she has to go before cannot make it to bathroom. No ataxic gait--not seen at CPE as well.  3.  Prediabetes:  Still drinking daily small soda.  Fair diet.  Walks around the backyard.  Current Meds  Medication Sig  . amLODipine (NORVASC) 2.5 MG tablet Take 1 tablet (2.5 mg total) by mouth daily.  Marland Kitchen. lisinopril-hydrochlorothiazide (PRINZIDE,ZESTORETIC) 20-25 MG tablet Take 1 tablet by mouth daily.  . naproxen (NAPROSYN) 500 MG tablet Take 1 tablet (500 mg  total) by mouth 2 (two) times daily with a meal.  . omeprazole (PRILOSEC) 40 MG capsule Take 1 capsule (40 mg total) by mouth daily.    No Known Allergies   Past Medical History:  Diagnosis Date  . GERD (gastroesophageal reflux disease)   . Hypertension   . Prediabetes 2013    Past Surgical History:  Procedure Laterality Date  . CATARACT EXTRACTION Right 2008   Family History  Problem Relation Age of Onset  . Hypertension Mother    Social History   Social History  . Marital status: Married    Spouse name: Alden BenjaminFernando Martiarena  . Number of children: 5  . Years of education: 1   Occupational History  . house cleaning in the past--retired    Social History Main Topics  . Smoking status: Never Smoker  . Smokeless tobacco: Never Used  . Alcohol use No  . Drug use: No  . Sexual activity: Not Currently   Other Topics Concern  . Not on file   Social History Narrative   Originally from [place]   [place]   Lives at home with her 2 daughters and one of daughter's grandsons   Her husband lives in [place]--he is 80 years old (2017)       Review of Systems     Objective:   Physical Exam   NAD HEENT:  PERRL, EOMI, dental loss and decay, throat  without injection Neck:  Supple, no adenopathy, no thyromegaly Chest:  CTA CV: RRR with normal S1 and S2, No S3, S4 or murmur.  Radial and DP pulses normal and equal Abd:  S, NT, No HSM or mass, + BS LE: trace edema  MMSE - Mini Mental State Exam 06/11/2016  Orientation to time 3  Orientation to Place 1  Registration 3  Attention/ Calculation 2  Recall 2  Language- name 2 objects 2  Language- repeat 1  Language- follow 3 step command 3  Language- read & follow direction 1  Write a sentence 0  Copy design 0  Total score 18       Assessment & Plan:  1.  Essential Hypertension:  Family to set up calendar with 2 pill boxes--one for as needed meds and one for BP meds and follow up behind her to be sure she  is taking meds.  2.  Moderate dementia:  Check CMP,TSH, CBC, today as well.  Start Aricept.    3.  Prediabetes:  A1C, FLP

## 2016-06-12 LAB — COMPREHENSIVE METABOLIC PANEL
A/G RATIO: 1.5 (ref 1.2–2.2)
ALBUMIN: 4.4 g/dL (ref 3.5–4.7)
ALT: 12 IU/L (ref 0–32)
AST: 24 IU/L (ref 0–40)
Alkaline Phosphatase: 93 IU/L (ref 39–117)
BUN/Creatinine Ratio: 23 (ref 12–28)
BUN: 25 mg/dL (ref 8–27)
Bilirubin Total: 0.6 mg/dL (ref 0.0–1.2)
CALCIUM: 9.6 mg/dL (ref 8.7–10.3)
CO2: 24 mmol/L (ref 18–29)
CREATININE: 1.11 mg/dL — AB (ref 0.57–1.00)
Chloride: 99 mmol/L (ref 96–106)
GFR, EST AFRICAN AMERICAN: 52 mL/min/{1.73_m2} — AB (ref >59)
GFR, EST NON AFRICAN AMERICAN: 45 mL/min/{1.73_m2} — AB (ref >59)
GLOBULIN, TOTAL: 3 g/dL (ref 1.5–4.5)
Glucose: 112 mg/dL — ABNORMAL HIGH (ref 65–99)
POTASSIUM: 4.8 mmol/L (ref 3.5–5.2)
SODIUM: 140 mmol/L (ref 134–144)
TOTAL PROTEIN: 7.4 g/dL (ref 6.0–8.5)

## 2016-06-12 LAB — CBC WITH DIFFERENTIAL/PLATELET
BASOS: 1 %
Basophils Absolute: 0 10*3/uL (ref 0.0–0.2)
EOS (ABSOLUTE): 0.1 10*3/uL (ref 0.0–0.4)
EOS: 1 %
HEMATOCRIT: 39.2 % (ref 34.0–46.6)
HEMOGLOBIN: 13 g/dL (ref 11.1–15.9)
IMMATURE GRANS (ABS): 0 10*3/uL (ref 0.0–0.1)
IMMATURE GRANULOCYTES: 0 %
LYMPHS: 32 %
Lymphocytes Absolute: 1.6 10*3/uL (ref 0.7–3.1)
MCH: 29.4 pg (ref 26.6–33.0)
MCHC: 33.2 g/dL (ref 31.5–35.7)
MCV: 89 fL (ref 79–97)
MONOCYTES: 7 %
MONOS ABS: 0.4 10*3/uL (ref 0.1–0.9)
NEUTROS PCT: 59 %
Neutrophils Absolute: 3.1 10*3/uL (ref 1.4–7.0)
Platelets: 199 10*3/uL (ref 150–379)
RBC: 4.42 x10E6/uL (ref 3.77–5.28)
RDW: 15.2 % (ref 12.3–15.4)
WBC: 5.2 10*3/uL (ref 3.4–10.8)

## 2016-06-12 LAB — LIPID PANEL W/O CHOL/HDL RATIO
Cholesterol, Total: 176 mg/dL (ref 100–199)
HDL: 58 mg/dL (ref >39)
LDL CALC: 88 mg/dL (ref 0–99)
TRIGLYCERIDES: 150 mg/dL — AB (ref 0–149)
VLDL Cholesterol Cal: 30 mg/dL (ref 5–40)

## 2016-06-12 LAB — TSH: TSH: 1.89 u[IU]/mL (ref 0.450–4.500)

## 2016-06-12 LAB — HGB A1C W/O EAG: HEMOGLOBIN A1C: 5.8 % — AB (ref 4.8–5.6)

## 2016-08-04 ENCOUNTER — Encounter: Payer: Self-pay | Admitting: Internal Medicine

## 2016-08-12 ENCOUNTER — Ambulatory Visit: Payer: Self-pay | Admitting: Internal Medicine

## 2016-08-24 ENCOUNTER — Encounter: Payer: Self-pay | Admitting: Internal Medicine

## 2016-09-18 ENCOUNTER — Encounter: Payer: Self-pay | Admitting: Internal Medicine

## 2016-09-18 ENCOUNTER — Ambulatory Visit (INDEPENDENT_AMBULATORY_CARE_PROVIDER_SITE_OTHER): Payer: Self-pay | Admitting: Internal Medicine

## 2016-09-18 DIAGNOSIS — F0390 Unspecified dementia without behavioral disturbance: Secondary | ICD-10-CM

## 2016-09-18 MED ORDER — DONEPEZIL HCL 5 MG PO TABS: 5.0000 mg | ORAL_TABLET | Freq: Every day | ORAL | 11 refills | Status: DC

## 2016-09-18 NOTE — Progress Notes (Signed)
   Subjective:    Patient ID: Holly Anderson, female    DOB: 05-30-1928, 83 y.o.   MRN: [id number]  HPI   1.  Dementia:  Did not pick up Aricept as pharmacy did not have.  Did not call.   She moved to live with her son in mid January, who lives away from a busy street.  Her daughter feels the voices she was hearing before may have been from living on [address] and hearing people and cars going by.  She is no longer hearing voices since the move.   Hard to get information adequately, but sounds like no change with memory. Pillbox was set up with her daily meds.  Daughter is not sure how often she has to be reminded to take her medication.  Patient states she is good about taking her pills.    2.  Prediabetes:  Having one soda daily.  A1C was at 5.8%.  Not really doing anything physically active.  Daughter states her brother has removed all other sugar out of the house.  3.  Essential Hypertension:  Is taking bp meds now.    4.  Fell and hit back of head in bathtub.  Did not have non slip stickers on bathtub floor, nor a handle.  Did not lose consciousness.  Has been acting okay since.  Current Meds  Medication Sig  . amLODipine (NORVASC) 2.5 MG tablet Take 1 tablet (2.5 mg total) by mouth daily.  Marland Kitchen. gabapentin (NEURONTIN) 100 MG capsule Take 1 capsule (100 mg total) by mouth at bedtime.  Marland Kitchen. lisinopril-hydrochlorothiazide (PRINZIDE,ZESTORETIC) 20-25 MG tablet Take 1 tablet by mouth daily.  . naproxen (NAPROSYN) 500 MG tablet Take 1 tablet (500 mg total) by mouth 2 (two) times daily with a meal.  . omeprazole (PRILOSEC) 40 MG capsule Take 1 capsule (40 mg total) by mouth daily.    No Known Allergies    .     Review of Systems     Objective:   Physical Exam NAD HEENT: mild swelling at left nuchal ridge.  No obvious bruising.  No break in skin.  PERRL, EOMI,  Neck:  Supple, No adenopathy Chest:  CTA CV:  RRR with normal S1 and S2, No S3, S4 or murmur, radial pulses normal and  equal Neuro:  A & O x2 (baseline), CN II-XII grossly intact, gait normal and motor 5/5       Assessment & Plan:  1.  Dementia:  Found Good Rx coupon for $4 at [employer], so will send Donepezil there.  Discussed to call clinic in future if unable to get medication.  2.  Prediabetes:  To continue to work on lifestyle changes.  Not clear she is moving adequately in a good direction.  3.  Essential Hypertension:  Much improved taking medication  4.  Closed head injury:  No concerning findings via history or physical exam.  To call if develops new mental status changes.

## 2016-09-18 NOTE — Patient Instructions (Signed)

## 2016-10-08 ENCOUNTER — Telehealth: Payer: Self-pay | Admitting: Internal Medicine

## 2016-10-08 NOTE — Telephone Encounter (Signed)
Spoke with son. Informed patient has refills left on medication. States her orange card has expired and health department will not accept expired card so patient has to pay out of pocket for medication. Patient may need a Rx called into [facility]. Son will call back and let us know if [facility]

## 2016-10-08 NOTE — Telephone Encounter (Signed)
Patient needs refill for amLODipine (NORVASC) 2.5 mg tablet and lisinopriol-hydrochlorothiazide (PRINZIDE,ZESTORETIC) 20-25 mg tablet.  Son stated hat mom is out of all of the patient's Rx.  Would like a refill on all so that they can pick up all at one time.

## 2016-10-10 ENCOUNTER — Other Ambulatory Visit: Payer: Self-pay

## 2016-10-10 DIAGNOSIS — R208 Other disturbances of skin sensation: Secondary | ICD-10-CM

## 2016-10-10 DIAGNOSIS — I10 Essential (primary) hypertension: Secondary | ICD-10-CM

## 2016-10-10 MED ORDER — LISINOPRIL-HYDROCHLOROTHIAZIDE 20-25 MG PO TABS: 1.0000 | ORAL_TABLET | Freq: Every day | ORAL | 11 refills | Status: DC

## 2016-10-10 MED ORDER — GABAPENTIN 100 MG PO CAPS: 100.0000 mg | ORAL_CAPSULE | Freq: Every day | ORAL | 11 refills | Status: DC

## 2016-10-10 MED ORDER — AMLODIPINE BESYLATE 2.5 MG PO TABS: 2.5000 mg | ORAL_TABLET | Freq: Every day | ORAL | 11 refills | Status: DC

## 2016-10-10 MED ORDER — NAPROXEN 500 MG PO TABS: 500.0000 mg | ORAL_TABLET | Freq: Two times a day (BID) | ORAL | 11 refills | Status: DC

## 2016-10-10 MED ORDER — OMEPRAZOLE 40 MG PO CPDR: 40.0000 mg | DELAYED_RELEASE_CAPSULE | Freq: Every day | ORAL | 11 refills | Status: DC

## 2016-11-18 ENCOUNTER — Ambulatory Visit (INDEPENDENT_AMBULATORY_CARE_PROVIDER_SITE_OTHER): Payer: Self-pay | Admitting: Internal Medicine

## 2016-11-18 ENCOUNTER — Encounter: Payer: Self-pay | Admitting: Internal Medicine

## 2016-11-18 VITALS — BP 138/60 | HR 68 | Resp 12 | Ht <= 58 in | Wt 150.0 lb

## 2016-11-18 DIAGNOSIS — M25561 Pain in right knee: Secondary | ICD-10-CM

## 2016-11-18 DIAGNOSIS — R7303 Prediabetes: Secondary | ICD-10-CM

## 2016-11-18 DIAGNOSIS — F0390 Unspecified dementia without behavioral disturbance: Secondary | ICD-10-CM

## 2016-11-18 DIAGNOSIS — M25562 Pain in left knee: Secondary | ICD-10-CM

## 2016-11-18 DIAGNOSIS — M542 Cervicalgia: Secondary | ICD-10-CM

## 2016-11-18 DIAGNOSIS — H5711 Ocular pain, right eye: Secondary | ICD-10-CM

## 2016-11-18 DIAGNOSIS — G8929 Other chronic pain: Secondary | ICD-10-CM

## 2016-11-18 DIAGNOSIS — I10 Essential (primary) hypertension: Secondary | ICD-10-CM

## 2016-11-18 NOTE — Progress Notes (Signed)
   Subjective:    Patient ID: Holly Anderson, female    DOB: Dec 11, 1927, 83 y.o.   MRN: [id number]  HPI   Daughter, Charlott Rakes, interprets today.  1.  Dementia: Did not bring meds today.  Aida's sister follows up behind to make sure their mother is taking her meds.  Pill box set up.   Aida feels her mother's memory has stabilized.   Discussed she will still have fluctuance with memory.  2.  Pinprick like right eye pain about 1 week ago.  Lasted for 15 minutes.  Placed ice on her eyes and resolved.  Last year, was seen by eye doctor.  Not clear where. Eye pain has not recurred.  3. Essential Hypertension:  Taking medication regularly.    4.  Prediabetes:  A1C in November was 5.8%.  Not physically active.  Drinking Coke at least once daily.  Exercise bike pedal broke, so stopped bing physically active.    5.  End of visit:  Brings up headaches--points to left neck and retroauricular area/nuchal area.       Current Meds  Medication Sig  . amLODipine (NORVASC) 2.5 MG tablet Take 1 tablet (2.5 mg total) by mouth daily.  Marland Kitchen donepezil (ARICEPT) 5 MG tablet Take 1 tablet (5 mg total) by mouth at bedtime.  . gabapentin (NEURONTIN) 100 MG capsule Take 1 capsule (100 mg total) by mouth at bedtime.  Marland Kitchen lisinopril-hydrochlorothiazide (PRINZIDE,ZESTORETIC) 20-25 MG tablet Take 1 tablet by mouth daily.  . naproxen (NAPROSYN) 500 MG tablet Take 1 tablet (500 mg total) by mouth 2 (two) times daily with a meal.  . omeprazole (PRILOSEC) 40 MG capsule Take 1 capsule (40 mg total) by mouth daily.    No Known Allergies  Review of Systems     Objective:   Physical Exam  NAD HEENT:  PERRL, EOMI, dense cataract on left.  Unable to visualize disc well on right.  Temporal arteries soft/pliant with good pulses bilaterally, nontender. Neck:  Very tight bilaterally over traps, somewhat tender and continues up left neck No adenopathy Chest:  CTA CV:  RRR with normal S1 and S2, Grade II/VI SEM heard best at right  2nd interspace and into carotids bilaterally.  Carotid, radial and DP pulses normal and Equal. Abd:  S, NT, No HSM or mass, + BS LE:  Hypertrophic changes to knees bilaterally         Assessment & Plan:  1.  Dementia:  Seems to have stabilized, but have never spoken to this daughter before.  CPM  2.  Essential Hypertension:  Controlled  3.  Right eye complaints:  With dense cataract on left, most of vision likely from right eye:  Referral to Optometry.  Patient refused referral in past.  4.  Headache/left neck pain:  [facility] referral.  To evaluate and treat quadriceps for bilateral knee arthritis as well.  5.  Prediabetes:  Discussed her weight is very gradually increasing.  To work on avoiding any more weight gain.  Exercise bike is something she can do.    6.  Heart murmur:  Heard this for first time in her carotids first.  Likely AS murmur.  She is not a good surgical candidates, so will follow only.  Daughter is in agreement.

## 2017-02-17 ENCOUNTER — Ambulatory Visit: Payer: Self-pay | Admitting: Internal Medicine

## 2017-10-27 ENCOUNTER — Other Ambulatory Visit: Payer: Self-pay | Admitting: Internal Medicine

## 2017-10-27 DIAGNOSIS — I10 Essential (primary) hypertension: Secondary | ICD-10-CM

## 2017-10-27 DIAGNOSIS — R208 Other disturbances of skin sensation: Secondary | ICD-10-CM

## 2017-11-11 ENCOUNTER — Ambulatory Visit: Payer: Self-pay | Admitting: Internal Medicine

## 2017-11-11 ENCOUNTER — Encounter: Payer: Self-pay | Admitting: Internal Medicine

## 2017-11-11 VITALS — BP 140/80 | HR 70 | Resp 12 | Ht <= 58 in | Wt 130.0 lb

## 2017-11-11 DIAGNOSIS — F0390 Unspecified dementia without behavioral disturbance: Secondary | ICD-10-CM

## 2017-11-11 DIAGNOSIS — R7303 Prediabetes: Secondary | ICD-10-CM

## 2017-11-11 DIAGNOSIS — M25562 Pain in left knee: Secondary | ICD-10-CM

## 2017-11-11 DIAGNOSIS — G8929 Other chronic pain: Secondary | ICD-10-CM

## 2017-11-11 DIAGNOSIS — M25561 Pain in right knee: Secondary | ICD-10-CM

## 2017-11-11 DIAGNOSIS — I10 Essential (primary) hypertension: Secondary | ICD-10-CM

## 2017-11-11 MED ORDER — DONEPEZIL HCL 5 MG PO TABS: 5.0000 mg | ORAL_TABLET | Freq: Every day | ORAL | 11 refills | Status: DC

## 2017-11-11 NOTE — Progress Notes (Signed)
   Subjective:    Patient ID: Holly ConnersPueblito Biagini, female    DOB: [birth year], [age] y.o.   MRN: [id number]  HPI   1.  Dementia:  Daughter states she lost med (aricept) and could not remember the name to call the clinic and let us know.  Daughter thinks she has been off the Aricept for about 6 + months.    2.  Knee and neck pain:  Went to PT, but missed her follow up appt with me back in July of 2018.  Son and daughter state they did not realize they missed an appt.  She states the PT helped.  Walks okay with a cane. Later, states she almost fell and struck her right upper outer arm on a chair when tripped over a threshold.    3.  Having bumps on her upper outer arm.  Sometimes itchy.  Another daughter has stated it just looks like heat rash.  Has on anterior thigh.   Current Meds  Medication Sig  . amLODipine (NORVASC) 2.5 MG tablet TAKE 1 TABLET BY MOUTH ONCE DAILY  . gabapentin (NEURONTIN) 100 MG capsule TAKE 1 CAPSULE BY MOUTH AT BEDTIME  . lisinopril-hydrochlorothiazide (PRINZIDE,ZESTORETIC) 20-25 MG tablet TAKE 1 TABLET BY MOUTH ONCE DAILY  . naproxen (NAPROSYN) 500 MG tablet TAKE 1 TABLET BY MOUTH TWICE DAILY WITH A MEAL  . omeprazole (PRILOSEC) 40 MG capsule TAKE 1 CAPSULE BY MOUTH ONCE DAILY    No Known Allergies   Review of Systems     Objective:   Physical Exam  NAD, happy HEENT: PERRL, EOMI Neck:  Supple, No adenopathy Chest:  CTA CV:  RRR with Grade II/VI SEM murmur unchanged.  Radial and DP pulses normal and equal Abd:  S, NT, No HSM or mass, + BS Knees:  Hypertrophic changes bilaterally.  Limps fairly significantly to exam table. Skin:  Dry flaking skin.      Assessment & Plan:  1.  Dementia and weight loss:  Likes to eat with others, so not eating much.  Family weighing options to have someone come sit with her for meals while family working. To get restarted on Aricept  2.  Chronic DJD and pain of bilateral knees:  Discussed possibility of knee injections if she so  chooses in the future.  3.  Prediabetes:  Discussed enjoying her small amount of soda daily and other foods vs. Decreased quality of life to prevent diabetes at her advanced age.  She needs to decide what is more important to her daily life.   Discussed possible complications should she develop diabetes.  4.  Essential Hypertension:  Fair control.

## 2017-12-16 ENCOUNTER — Ambulatory Visit: Payer: Self-pay | Admitting: Internal Medicine

## 2017-12-16 ENCOUNTER — Encounter: Payer: Self-pay | Admitting: Internal Medicine

## 2017-12-16 VITALS — BP 138/70 | HR 70 | Resp 12 | Ht <= 58 in | Wt 132.0 lb

## 2017-12-16 DIAGNOSIS — G8929 Other chronic pain: Secondary | ICD-10-CM

## 2017-12-16 DIAGNOSIS — M25561 Pain in right knee: Secondary | ICD-10-CM

## 2017-12-16 DIAGNOSIS — M25562 Pain in left knee: Secondary | ICD-10-CM

## 2017-12-16 MED ORDER — METHYLPREDNISOLONE ACETATE 40 MG/ML IJ SUSP
40.0000 mg | Freq: Once | INTRAMUSCULAR | Status: AC
  Administered 2017-12-16: 40 mg via INTRAMUSCULAR

## 2017-12-16 MED ORDER — METHYLPREDNISOLONE ACETATE 40 MG/ML IJ SUSP
40.0000 mg | Freq: Once | INTRAMUSCULAR | Status: AC
  Administered 2017-12-16: 40 mg via INTRA_ARTICULAR

## 2017-12-16 NOTE — Progress Notes (Signed)
   Subjective:    Patient ID: Holly Anderson, female    DOB: 01-24-1928, 84 y.o.   MRN: [id number]  HPI   Here for corticosteroid injection of bilateral knees. History of chronic bilateral knee DJD and pain Discussed possible complications and benefits. Patient and her daughter elected to proceed.      Review of Systems     Objective:   Physical Exam  Bilateral knees cleaned in sterile fashion.  Injection of 2.5 ml 1% lidocaine injected subcutaneously and along tract of tissue to medial knee joint.  1 ml of 40 mg/ml of Depo medrol mixed with 0.5 ml of lidocaine injected into medial knee joint. Patient tolerated well without complication.  Some improvement of pain following injections.        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 Has follow up in June for other concerns.

## 2017-12-16 NOTE — Patient Instructions (Signed)
Call clinic if increased knee pain, swelling, redness of knees or fever.

## 2018-01-13 ENCOUNTER — Ambulatory Visit: Payer: Self-pay | Admitting: Internal Medicine

## 2018-04-06 ENCOUNTER — Ambulatory Visit: Payer: Self-pay | Admitting: Internal Medicine

## 2018-04-13 ENCOUNTER — Encounter: Payer: Self-pay | Admitting: Internal Medicine

## 2018-04-13 ENCOUNTER — Ambulatory Visit: Payer: Self-pay | Admitting: Internal Medicine

## 2018-04-13 VITALS — BP 150/80 | HR 70 | Resp 12 | Ht <= 58 in | Wt 129.0 lb

## 2018-04-13 DIAGNOSIS — R208 Other disturbances of skin sensation: Secondary | ICD-10-CM

## 2018-04-13 DIAGNOSIS — F0390 Unspecified dementia without behavioral disturbance: Secondary | ICD-10-CM

## 2018-04-13 MED ORDER — GABAPENTIN 100 MG PO CAPS: ORAL_CAPSULE | ORAL | 11 refills | Status: DC

## 2018-04-13 NOTE — Progress Notes (Signed)
   Subjective:    Patient ID: Holly Anderson, female    DOB: [birth year], 84 y.o.   MRN: [id number]  HPI Here for follow up mainly of dementia after start of Aricept in April.    1.  Dementia:  Son states she has been seeing animals and things crawling in through the windows and crawling on her arms and legs.  Her daughter states this has been going on for a year, but they feel this has become more frequent.   Often feels as if she is part of the story on TV--that the actors are talking to her with signs.  She states they never tell her to do anything bad--to come visit them.  This happens maybe once weekly.  Generally, this is with live TV when cameras go by and waving at the camera--she feels they are waving at her. Describes flying insects coming through windows and biting her on arms and legs. One year ago, she was stating bugs were coming out of the carpet and biting her.  No one else noted anything.  No bits on skin.   Son states her symptoms with these concerns has gradually worsened with time, not suddenly worse with Aricept.    Family did move into a new home about 6 months ago, which has caused her stress.   Not sleeping well.  Afraid at night.  Sleeping throughout day--not afraid then with daylight.  Current Meds  Medication Sig  . amLODipine (NORVASC) 2.5 MG tablet TAKE 1 TABLET BY MOUTH ONCE DAILY  . donepezil (ARICEPT) 5 MG tablet Take 1 tablet (5 mg total) by mouth at bedtime.  . gabapentin (NEURONTIN) 100 MG capsule 2 caps by mouth at bedtime daily  . lisinopril-hydrochlorothiazide (PRINZIDE,ZESTORETIC) 20-25 MG tablet TAKE 1 TABLET BY MOUTH ONCE DAILY  . naproxen (NAPROSYN) 500 MG tablet TAKE 1 TABLET BY MOUTH TWICE DAILY WITH A MEAL  . omeprazole (PRILOSEC) 40 MG capsule TAKE 1 CAPSULE BY MOUTH ONCE DAILY  . [DISCONTINUED] gabapentin (NEURONTIN) 100 MG capsule TAKE 1 CAPSULE BY MOUTH AT BEDTIME    No Known Allergies  Review of Systems     Objective:   Physical  Exam NAD Lungs:  CTA CV:  RRR with Grade II/VI SEM murmur unchanged.  Radial pulses normal and equal.       Assessment & Plan:  1.  Dementia:  Continue Aricept Discussed concerning complications/side effects with antipsychotic medication to control paranoia. Prefer to work with getting her settled in relatively new setting and working with her to improve sleep wake cycle so not up all night.   Increase Gabapentin at bedtime to 200 mg to see if this helps with sleep.   Consistent bedtime and wake time.  2.  Heart Murmur:  Family to decide whether would pursue treatment.  When spoke with daughter Holly Anderson, who usually accompanies her to visits, seemed that she and her mother would not pursue any treatment, so decided to not send for echocardiogram. Not clear everyone is on same page. They will let me know.  3.  Peripheral neuropathy:  Increase Gabapentin as above.  Followup in 3 months

## 2018-04-27 ENCOUNTER — Ambulatory Visit: Payer: Self-pay | Admitting: Internal Medicine

## 2018-07-07 ENCOUNTER — Encounter: Payer: Self-pay | Admitting: Internal Medicine

## 2018-07-07 ENCOUNTER — Ambulatory Visit: Payer: Self-pay | Admitting: Internal Medicine

## 2018-07-07 VITALS — BP 140/78 | HR 76 | Resp 12 | Ht <= 58 in | Wt 127.0 lb

## 2018-07-07 DIAGNOSIS — F03918 Unspecified dementia, unspecified severity, with other behavioral disturbance: Secondary | ICD-10-CM

## 2018-07-07 DIAGNOSIS — H259 Unspecified age-related cataract: Secondary | ICD-10-CM

## 2018-07-07 DIAGNOSIS — F0391 Unspecified dementia with behavioral disturbance: Secondary | ICD-10-CM

## 2018-07-07 DIAGNOSIS — Z23 Encounter for immunization: Secondary | ICD-10-CM

## 2018-07-07 DIAGNOSIS — F0390 Unspecified dementia without behavioral disturbance: Secondary | ICD-10-CM

## 2018-07-07 MED ORDER — DONEPEZIL HCL 10 MG PO TABS: ORAL_TABLET | ORAL | 11 refills | Status: DC

## 2018-07-07 MED ORDER — DICLOFENAC SODIUM 1 % TD GEL
TRANSDERMAL | 11 refills | Status: DC
Start: 2018-07-07 — End: 2018-07-07

## 2018-07-07 MED ORDER — QUETIAPINE FUMARATE 25 MG PO TABS
ORAL_TABLET | ORAL | 11 refills | Status: DC
Start: 2018-07-07 — End: 2018-07-07

## 2018-07-07 NOTE — Progress Notes (Signed)
   Subjective:   Patient ID: Holly Anderson, female    DOB: Jun 07, 1928, 84 y.o.   MRN: [id number]  HPI   1.  Dementia with some paranoia and difficulty with sleep wake cycle.  Sleeping a bit better, but still waking once to twice nightly with concerns animals and insects are eating everything in her room.  Unable to get back to sleep.  Feels like something and someone is tunneling into her room.   She gets frustrated that no one believes her --cries or gets mad.   She is at times not recognizing grandchildren and even one of her sons on the phone.   More so, just confused with short term memory issues.    2.  Concern for left eye with decreased vision.  Sounds like this is long term.  Eyes water and are itchy  Current Meds  Medication Sig  . amLODipine (NORVASC) 2.5 MG tablet TAKE 1 TABLET BY MOUTH ONCE DAILY  . donepezil (ARICEPT) 10 MG tablet 1 tab by mouth every night  . gabapentin (NEURONTIN) 100 MG capsule 2 caps by mouth at bedtime daily  . lisinopril-hydrochlorothiazide (PRINZIDE,ZESTORETIC) 20-25 MG tablet TAKE 1 TABLET BY MOUTH ONCE DAILY  . naproxen (NAPROSYN) 500 MG tablet TAKE 1 TABLET BY MOUTH TWICE DAILY WITH A MEAL  . omeprazole (PRILOSEC) 40 MG capsule TAKE 1 CAPSULE BY MOUTH ONCE DAILY  . [DISCONTINUED] donepezil (ARICEPT) 5 MG tablet Take 1 tablet (5 mg total) by mouth at bedtime.   No Known Allergies    Review of Systems   Objective:  Physical Exam Dense cataract, left eye. Lungs:  CTA CV:  RRR unable to hear murmur today.    Assessment & Plan:  1.  Dementia:  Increase Aricept to 10 mg daily.  Some confusion today as daughter thought the Aricept was what they increased after last visit, but clarified they were to increase Gabapentin.   Family agreed it was the Gabapentin they increased by end of visit. Also discussed the possibility of Seroquel if unable to get her paranoia and hallucinations to a point where she can sleep well.  Would like to avoid adding an  atypical antipsychotic.   2.  Left cataract: discussed application for Medicaid before [facility] and hopefully cataract removal and apply for financial assistance.    3.  Influenza vaccine  4.  OA of knees:  Diclofenac cream.

## 2018-08-23 ENCOUNTER — Encounter: Payer: Self-pay | Admitting: Internal Medicine

## 2018-08-24 ENCOUNTER — Ambulatory Visit: Payer: Self-pay | Admitting: Internal Medicine

## 2018-08-24 ENCOUNTER — Encounter: Payer: Self-pay | Admitting: Internal Medicine

## 2018-08-24 VITALS — BP 138/80 | HR 70 | Resp 12 | Ht <= 58 in | Wt 122.0 lb

## 2018-08-24 DIAGNOSIS — H259 Unspecified age-related cataract: Secondary | ICD-10-CM

## 2018-08-24 DIAGNOSIS — G4452 New daily persistent headache (NDPH): Secondary | ICD-10-CM

## 2018-08-24 DIAGNOSIS — M25562 Pain in left knee: Secondary | ICD-10-CM

## 2018-08-24 DIAGNOSIS — R7303 Prediabetes: Secondary | ICD-10-CM

## 2018-08-24 DIAGNOSIS — I10 Essential (primary) hypertension: Secondary | ICD-10-CM

## 2018-08-24 DIAGNOSIS — F0391 Unspecified dementia with behavioral disturbance: Secondary | ICD-10-CM

## 2018-08-24 DIAGNOSIS — R519 Headache, unspecified: Secondary | ICD-10-CM

## 2018-08-24 DIAGNOSIS — M25561 Pain in right knee: Secondary | ICD-10-CM

## 2018-08-24 DIAGNOSIS — R51 Headache: Secondary | ICD-10-CM

## 2018-08-24 DIAGNOSIS — F03918 Unspecified dementia, unspecified severity, with other behavioral disturbance: Secondary | ICD-10-CM

## 2018-08-24 DIAGNOSIS — G8929 Other chronic pain: Secondary | ICD-10-CM

## 2018-08-24 NOTE — Progress Notes (Signed)
Subjective:    Patient ID: Holly Anderson, female   DOB: May 04, 1928, 85 y.o.   MRN: [id number]   HPI   1.  Dementia:  Not hearing voices any longer since last visit.  She continues to be bothered by a sense of bugs climbing on her skin or she sees them on her skin or flying around.   Family has set up plan for 24/7 supervision at home for her. Discussed may ultimately need someone to help with all her ADLs as well. They feel her condition is manageable currently They would not put her in a nursing home.    2.  New Onset Headaches: Pain at her temples bilaterally.  Unable to describe type of pain, but squeezing her head on either side helps relieve the pain Having these headaches once or twice daily, short lived, but very severe.   Daughter states she has been having them for about 1 month, they last less than 5 minutes, sometimes there and just gone.  Quite severe at times.   No associated nausea.  No vomiting.  No photophobia or phonophobia.   No associated focal findings.   No neck or shoulder stiffness of pain or difficulty moving Same for her pelvic girdle area.   Has dense cataract on left and smaller ones on right, so hard to know if vision affected. No jaw claudication sx. No fevers   3.  Cataracts, left is dense:  Family given information that since last here, they will need to apply to [facility] for her and be denied before can go to [facility] for ophthalmology and apply for charity care.   Referral written 07/07/18.    4.  Bilateral knee DJD:  Daughter states Diclofenac gel helps significantly with pain.  Not clear how this was removed from med list, but sent to [facility].  And daughter states she is using.  Current Meds  Medication Sig  . amLODipine (NORVASC) 2.5 MG tablet TAKE 1 TABLET BY MOUTH ONCE DAILY  . donepezil (ARICEPT) 10 MG tablet 1 tab by mouth every night  . gabapentin (NEURONTIN) 100 MG capsule 2 caps by mouth at bedtime daily  .  lisinopril-hydrochlorothiazide (PRINZIDE,ZESTORETIC) 20-25 MG tablet TAKE 1 TABLET BY MOUTH ONCE DAILY  . naproxen (NAPROSYN) 500 MG tablet TAKE 1 TABLET BY MOUTH TWICE DAILY WITH A MEAL  . omeprazole (PRILOSEC) 40 MG capsule TAKE 1 CAPSULE BY MOUTH ONCE DAILY   No Known Allergies   Review of Systems    Objective:   BP 138/80 (BP Location: Left Arm, Patient Position: Sitting, Cuff Size: Normal)   Pulse 70   Resp 12   Ht 4\' 7"  (1.397 m)   Wt 122 lb (55.3 kg)   BMI 28.36 kg/m   Physical Exam NAD HEENT:  Bilateral cataracts, left is quite dense.  Unable to see discs bilaterally.  NT over bilateral temporal arteries, which are both elastic to palpation with good pulses.   Neck/shoulders:  Somewhat limited ability to full extension and abduction bilaterally.  Neck with full ROM. Chest:  CTA CV: RRR with murmur unchanged.  Radial pulses normal and equal Neuro:  CN II-XII grossly intact Motor 5/5.   MS:  Difficult to assess for pelvic girdle stiffness due to significant DJD of knees and gait due to pain  Assessment & Plan   1.  Dementia:  Perhaps a bit improved as no longer hearing voices.  Family is already making plans for future  needs of help with more of her ADLs at this point.   Only concern is to help her seem more relaxed and comfortable for sleep. Continue Donepezil 10 mg daily.  2.  New Onset Headaches:  CT of head.  CBC, CMP, TSH, Sed rate, A1C. Consider increase of Donepezil as cause as well.  3.  Hypertension:  Controlled with Amlodipine, Lisinopril/HCTZ.  4.  Prediabetes:  A1C.  5.  Cataracts:  Family to go to DSS and apply for Medicaid and then will apply for [facility].  6.  OA of knees:  Diclofenac gel

## 2018-08-25 LAB — COMPREHENSIVE METABOLIC PANEL
A/G RATIO: 1.6 (ref 1.2–2.2)
ALBUMIN: 4.2 g/dL (ref 3.6–4.6)
ALK PHOS: 83 IU/L (ref 39–117)
ALT: 13 IU/L (ref 0–32)
AST: 27 IU/L (ref 0–40)
BUN / CREAT RATIO: 30 — AB (ref 12–28)
BUN: 26 mg/dL (ref 8–27)
Bilirubin Total: 0.7 mg/dL (ref 0.0–1.2)
CHLORIDE: 102 mmol/L (ref 96–106)
CO2: 23 mmol/L (ref 20–29)
Calcium: 9.5 mg/dL (ref 8.7–10.3)
Creatinine, Ser: 0.87 mg/dL (ref 0.57–1.00)
GFR calc non Af Amer: 60 mL/min/{1.73_m2} (ref >59)
GFR, EST AFRICAN AMERICAN: 69 mL/min/{1.73_m2} (ref >59)
Globulin, Total: 2.6 g/dL (ref 1.5–4.5)
Glucose: 123 mg/dL — ABNORMAL HIGH (ref 65–99)
POTASSIUM: 4 mmol/L (ref 3.5–5.2)
SODIUM: 141 mmol/L (ref 134–144)
TOTAL PROTEIN: 6.8 g/dL (ref 6.0–8.5)

## 2018-08-25 LAB — CBC WITH DIFFERENTIAL/PLATELET
Basophils Absolute: 0.1 x10E3/uL (ref 0.0–0.2)
Basos: 1 %
EOS (ABSOLUTE): 0 x10E3/uL (ref 0.0–0.4)
Eos: 1 %
Hematocrit: 38.4 % (ref 34.0–46.6)
Hemoglobin: 12.9 g/dL (ref 11.1–15.9)
Immature Grans (Abs): 0 x10E3/uL (ref 0.0–0.1)
Immature Granulocytes: 0 %
Lymphocytes Absolute: 1.3 x10E3/uL (ref 0.7–3.1)
Lymphs: 31 %
MCH: 30.9 pg (ref 26.6–33.0)
MCHC: 33.6 g/dL (ref 31.5–35.7)
MCV: 92 fL (ref 79–97)
Monocytes Absolute: 0.5 x10E3/uL (ref 0.1–0.9)
Monocytes: 11 %
Neutrophils Absolute: 2.4 x10E3/uL (ref 1.4–7.0)
Neutrophils: 56 %
Platelets: 193 x10E3/uL (ref 150–450)
RBC: 4.17 x10E6/uL (ref 3.77–5.28)
RDW: 13 % (ref 11.7–15.4)
WBC: 4.3 x10E3/uL (ref 3.4–10.8)

## 2018-08-25 LAB — TSH: TSH: 1.94 u[IU]/mL (ref 0.450–4.500)

## 2018-08-25 LAB — HGB A1C W/O EAG: HEMOGLOBIN A1C: 5.5 % (ref 4.8–5.6)

## 2018-08-25 LAB — SEDIMENTATION RATE: Sed Rate: 36 mm/hr (ref 0–40)

## 2018-11-16 ENCOUNTER — Other Ambulatory Visit: Payer: Self-pay | Admitting: Internal Medicine

## 2018-11-16 DIAGNOSIS — R208 Other disturbances of skin sensation: Secondary | ICD-10-CM

## 2018-11-23 ENCOUNTER — Ambulatory Visit: Payer: Self-pay | Admitting: Internal Medicine

## 2018-11-30 ENCOUNTER — Other Ambulatory Visit: Payer: Self-pay | Admitting: Internal Medicine

## 2018-11-30 DIAGNOSIS — I10 Essential (primary) hypertension: Secondary | ICD-10-CM

## 2019-01-05 ENCOUNTER — Other Ambulatory Visit: Payer: Self-pay

## 2019-01-05 ENCOUNTER — Ambulatory Visit: Payer: Self-pay | Admitting: Internal Medicine

## 2019-01-05 ENCOUNTER — Encounter: Payer: Self-pay | Admitting: Internal Medicine

## 2019-01-05 VITALS — BP 132/70 | HR 72 | Resp 12 | Ht <= 58 in | Wt 119.0 lb

## 2019-01-05 DIAGNOSIS — F03918 Unspecified dementia, unspecified severity, with other behavioral disturbance: Secondary | ICD-10-CM

## 2019-01-05 DIAGNOSIS — H259 Unspecified age-related cataract: Secondary | ICD-10-CM

## 2019-01-05 DIAGNOSIS — M25561 Pain in right knee: Secondary | ICD-10-CM

## 2019-01-05 DIAGNOSIS — F0391 Unspecified dementia with behavioral disturbance: Secondary | ICD-10-CM

## 2019-01-05 DIAGNOSIS — I10 Essential (primary) hypertension: Secondary | ICD-10-CM

## 2019-01-05 DIAGNOSIS — M25562 Pain in left knee: Secondary | ICD-10-CM

## 2019-01-05 DIAGNOSIS — G8929 Other chronic pain: Secondary | ICD-10-CM

## 2019-01-05 DIAGNOSIS — F321 Major depressive disorder, single episode, moderate: Secondary | ICD-10-CM

## 2019-01-05 DIAGNOSIS — R208 Other disturbances of skin sensation: Secondary | ICD-10-CM

## 2019-01-05 DIAGNOSIS — R7303 Prediabetes: Secondary | ICD-10-CM

## 2019-01-05 MED ORDER — CITALOPRAM HYDROBROMIDE 10 MG PO TABS: 10.0000 mg | ORAL_TABLET | Freq: Every day | ORAL | 11 refills | Status: DC

## 2019-01-05 MED ORDER — GABAPENTIN 100 MG PO CAPS: ORAL_CAPSULE | ORAL | 11 refills | Status: DC

## 2019-01-05 MED ORDER — DICLOFENAC SODIUM 1 % TD GEL: TRANSDERMAL | 11 refills | Status: DC

## 2019-01-05 NOTE — Progress Notes (Signed)
Subjective:    Patient ID: Holly Anderson, female   DOB: 04/18/1928, 85 y.o.   MRN: [id number]  HPI   1.  Dementia:  Still with sense of insects crawling on her skin.  Crying all the time about everything. Can be due to other's frustration with her or just anything.  2.  Insomnia:  Starts waking up at 1 a.m. and then up every 2 hours.  Her daughter states she is pacing around in her room.  She naps all day.  When they try to get her to stop napping she ends up shouting and crying at them.  Two daughters are home with her.   For some reason, her Gabapentin got switched to 100 mg at bedtime from 200 mg when pharmacy sent request for refill back in April.   She apparently slept better with 2.  3.  Prediabetes:  Not drinking soda so much.  Daughter states they give her some when she requests.  Appetite is down a bit.  She is also somewhat combative about eating at times.  4.  Headaches:  Complains for a left sided temple headache, but then carries on with her regular ADLs without obvious discomfort.  Labs, including sed rate and exam did not support giant cell arteritis.  No concerning findings on exam as well.   5.  Left Cataract:  Did not apply for Medicaid.  Daughter did not understand why they were applying for Medicaid when she was turned down years ago.  Discussed denial needed to be able to apply for [facility]. She seems to understand today.    6.  Bilateral knee pain:  Not utilizing Diclofenac gel daily--only when really bad. Later, seems that they have never obtained the gel and it was discontinued.  Must be using some OTC gel.  Asked they bring all meds to visit.  7.  Left shoulder pain:  Unknown gel they are using as well and helps.     Current Meds  Medication Sig  . amLODipine (NORVASC) 2.5 MG tablet Take 1 tablet by mouth once daily  . donepezil (ARICEPT) 10 MG tablet 1 tab by mouth every night  . gabapentin (NEURONTIN) 100 MG capsule Take 1 capsule by mouth at  bedtime (Patient taking differently: Take 2 capsules by mouth at bedtime)  . lisinopril-hydrochlorothiazide (ZESTORETIC) 20-25 MG tablet Take 1 tablet by mouth once daily  . naproxen (NAPROSYN) 500 MG tablet TAKE 1 TABLET BY MOUTH TWICE DAILY WITH MEALS  . omeprazole (PRILOSEC) 40 MG capsule TAKE 1 CAPSULE BY MOUTH ONCE DAILY   No Known Allergies   Review of Systems    Objective:   BP 132/70 (BP Location: Left Arm, Patient Position: Sitting, Cuff Size: Normal)   Pulse 72   Resp 12   Ht 4\' 7"  (1.397 m)   Wt 119 lb (54 kg)   LMP 01/05/2019 (LMP Unknown)   BMI 27.66 kg/m   Physical Exam NAD HEENT:  Dense cataract, left eye. Neck:  Supple, No adenopathy Chest:  CTA CV:  RRR without murmur unchanged.  Radial pulses normal and equal LE:  Hypertrophic knees without effusion or erythema.  Shoulders:  Mild decreased extension and abduction bilaterally.  Assessment & Plan   1.  Dementia with behavioral concerns/likely some depression/sleep disruption:  Restart Gabapentin at 200 mg dose at bedtime hopefully for better sleep and less home disruption. Start Citalopram 10 mg daily as well.  2.  OA/pain of knees and shoulder:  Diclofenac gel up to 4 times daily as needed.  To use at least twice daily on regular basis.  3. Headaches:  Not clear how significant these are:  To call if worsening or new symptoms.  4.  Left cataract:  Encouraged to apply for Medicaid--will likely get turned down and then can [facility] and cataract extraction.  Improved vision may help with her behavioral difficulties if her vision is improved--better sense of her surroundings.   5.  Hypertension:  Controlled  6.  Prediabetes:  Doing well with diet.

## 2019-02-03 ENCOUNTER — Ambulatory Visit: Payer: Self-pay | Admitting: Internal Medicine

## 2019-02-09 ENCOUNTER — Other Ambulatory Visit: Payer: Self-pay

## 2019-02-09 ENCOUNTER — Encounter: Payer: Self-pay | Admitting: Internal Medicine

## 2019-02-09 ENCOUNTER — Ambulatory Visit: Payer: Self-pay | Admitting: Internal Medicine

## 2019-02-09 VITALS — BP 128/72 | HR 64 | Resp 12 | Ht <= 58 in | Wt 120.0 lb

## 2019-02-09 DIAGNOSIS — H259 Unspecified age-related cataract: Secondary | ICD-10-CM

## 2019-02-09 DIAGNOSIS — F03918 Unspecified dementia, unspecified severity, with other behavioral disturbance: Secondary | ICD-10-CM | POA: Insufficient documentation

## 2019-02-09 DIAGNOSIS — M25561 Pain in right knee: Secondary | ICD-10-CM

## 2019-02-09 DIAGNOSIS — F0391 Unspecified dementia with behavioral disturbance: Secondary | ICD-10-CM

## 2019-02-09 DIAGNOSIS — F321 Major depressive disorder, single episode, moderate: Secondary | ICD-10-CM

## 2019-02-09 DIAGNOSIS — I10 Essential (primary) hypertension: Secondary | ICD-10-CM

## 2019-02-09 DIAGNOSIS — M25562 Pain in left knee: Secondary | ICD-10-CM

## 2019-02-09 DIAGNOSIS — G8929 Other chronic pain: Secondary | ICD-10-CM

## 2019-02-09 DIAGNOSIS — F0392 Unspecified dementia, unspecified severity, with psychotic disturbance: Secondary | ICD-10-CM | POA: Insufficient documentation

## 2019-02-09 NOTE — Progress Notes (Signed)
    Subjective:    Patient ID: Holly Anderson, female   DOB: May 29, 1928, 85 y.o.   MRN: [id number]   HPI   Daughter, Verdis Frederickson interprets. Daughter, Gema, here as well for first time.  She interrupts as well.  1.  Insomnia:  Taking the 200 mg Gabapentin at bedtime.  She is now sleeping 10 hours, but up and down through the night. Sounds like she may wander around the house between 10 and midnight, but then settles down and falls asleep.  Generally sleeps until 9 a.m. Verdis Frederickson gets up at 4 a.m. to go to work and her sister, Cecelia Byars, or nephew, Glennie Isle, is with her through the day.  She is never left alone.    [place]ite feels she has good energy when she awakens in the morning.  She still tries to nap and can sleep for 3-4 hours if they let her.   She does get outside for walks.  2.  Dementia with depression:  Since starting Citalopram 10 mg daily and is crying less, but still with frustration and crying at times.  Still feeling "animals crawling on skin" and  People throwing dirt on her from the window.  Does not seem so upset by it now, however.   3.  Knee and shoulder pain:  Better with Diclofenac gel, but still having breakthrough pain with twice daily use often.  Discussed using up to 4 times daily if bad day with pain.  Current Meds  Medication Sig  . amLODipine (NORVASC) 2.5 MG tablet Take 1 tablet by mouth once daily  . citalopram (CELEXA) 10 MG tablet Take 1 tablet (10 mg total) by mouth daily.  . diclofenac sodium (VOLTAREN) 1 % GEL Apply total of 2 to 4 g to knees and shoulders twice daily for pain  . donepezil (ARICEPT) 10 MG tablet 1 tab by mouth every night  . gabapentin (NEURONTIN) 100 MG capsule Please note we were sent a refill request for the wrong dosing  Should be 2 at bedtime cancel previous  . lisinopril-hydrochlorothiazide (ZESTORETIC) 20-25 MG tablet Take 1 tablet by mouth once daily  . omeprazole (PRILOSEC) 40 MG capsule TAKE 1 CAPSULE BY MOUTH ONCE DAILY   No Known  Allergies   Review of Systems    Objective:   BP 128/72 (BP Location: Left Arm, Patient Position: Sitting, Cuff Size: Normal)   Pulse 64   Resp 12   Ht 4\' 7"  (1.397 m)   Wt 120 lb (54.4 kg)   LMP 01/05/2019 (LMP Unknown)   BMI 27.89 kg/m   Physical Exam  NAD HEENT: Dense left cataract Chest:  CTA CV:  RRR with systolic murmur LE:  No edema   Assessment & Plan  1.  Depression, insomnia, dementia:  Seems to be doing better--likely due to improved sleep. Move Gabapentin to 6 p.m. and try to get her up and around before 10 to get her walking about done and fall asleep by 10 p.m. Family member up by 7 a.m. should she awaken sooner. No increase in Citalopram or Gabapentin at this time. Also, to get outside on porch or yard more. Ask her to tell her history and record. Allow to crochet.  2.  Knee and shoulder pain:  May use Diclofenac gel up to 4 times daily if needed.  3.  Hypertension:  Controlled.  4.  Cataract:  rediscussed application for Medicaid as need denial to apply for [facility] for cataract removal.

## 2019-02-09 NOTE — Patient Instructions (Signed)
Try giving gabapentin at 6 p.m. and getting her up a bit earlier to do her walking around and bathroom so she falls asleep at 10 p.m..  Limit her naps--get her outside on porch and have someone get her history of tape--ask her questions about growing up and when she was a young adult to keep her mind interested   Allow her to get back to crocheting.

## 2019-03-03 ENCOUNTER — Other Ambulatory Visit: Payer: Self-pay | Admitting: Internal Medicine

## 2019-03-03 DIAGNOSIS — I10 Essential (primary) hypertension: Secondary | ICD-10-CM

## 2019-04-12 ENCOUNTER — Ambulatory Visit: Payer: Self-pay | Admitting: Internal Medicine

## 2019-05-04 ENCOUNTER — Telehealth (INDEPENDENT_AMBULATORY_CARE_PROVIDER_SITE_OTHER): Payer: Self-pay | Admitting: Internal Medicine

## 2019-05-04 DIAGNOSIS — F03918 Unspecified dementia, unspecified severity, with other behavioral disturbance: Secondary | ICD-10-CM

## 2019-05-04 DIAGNOSIS — G479 Sleep disorder, unspecified: Secondary | ICD-10-CM

## 2019-05-04 DIAGNOSIS — F0391 Unspecified dementia with behavioral disturbance: Secondary | ICD-10-CM

## 2019-05-04 DIAGNOSIS — H259 Unspecified age-related cataract: Secondary | ICD-10-CM

## 2019-05-04 DIAGNOSIS — H9193 Unspecified hearing loss, bilateral: Secondary | ICD-10-CM

## 2019-05-04 MED ORDER — DONEPEZIL HCL 10 MG PO TABS: ORAL_TABLET | ORAL | 11 refills | Status: DC

## 2019-05-04 MED ORDER — MEMANTINE HCL 5 MG PO TABS: ORAL_TABLET | ORAL | 11 refills | Status: DC

## 2019-05-04 NOTE — Progress Notes (Signed)
Subjective:    Patient ID: Holly Anderson, female   DOB: 22-Jan-1928, 85 y.o.   MRN: [id number]   HPI   1.  Sleep Disturbance:  Now taking Gabapentin around 6-7 p.m.  In bed and asleep by 8 or 9 p.m., but then up around 1-2 a.m. wandering around house.   Not a danger to herself or others with this.  Sits in living room and snacks a lot.  Goes back to bed about 1.5 hours later and then sleeps until 11:30 a.m. Verdis Frederickson is out of the house at 4:45 a.m. Her other daughter is out of house by 7 a.m. Discussed whether they need to consider safety issues with leaving her home alone for 4 hours while she sleeps. Deby will then often take 15 minute naps up to 4 times daily.  No one tries to wake her or dissuade her from naps. She is not getting outside much, particularly in sunlight. Little physical activity.  She is no longer seeing bugs on her skin, nor is she scratching at skin. She will see people at the doorway who are not there.   Relives stories from her youth.  Vision is still poor due to cataracts.  They have not completed Medicaid application.  Sounds like they called once and did not get a call back and did not follow up. Hearing is poor as well.  Was seen by audiology more than 4 years ago, but hearing aid was too expensive.  Her hearing is much worse now.  Current Meds  Medication Sig  . amLODipine (NORVASC) 2.5 MG tablet Take 1 tablet by mouth once daily  . citalopram (CELEXA) 10 MG tablet Take 1 tablet (10 mg total) by mouth daily.  . diclofenac sodium (VOLTAREN) 1 % GEL Apply total of 2 to 4 g to knees and shoulders twice daily for pain  . donepezil (ARICEPT) 10 MG tablet 1 tab by mouth every night  . gabapentin (NEURONTIN) 100 MG capsule Please note we were sent a refill request for the wrong dosing  Should be 2 at bedtime cancel previous  . lisinopril-hydrochlorothiazide (ZESTORETIC) 20-25 MG tablet Take 1 tablet by mouth once daily  . omeprazole (PRILOSEC) 40 MG capsule  TAKE 1 CAPSULE BY MOUTH ONCE DAILY   No Known Allergies   Review of Systems    Objective:   Physical Exam  Looks well, interacting with video and smiling.   Assessment & Plan   1.  Sleep Disturbance associated with dementia:   Again: Need to figure out a way for her to go to bed during nighttime hours and get more sun exposure that also works for folks in the home. Recommended finding someone if financially feasible to be with her in the morning to sit outside and drink a hot drink in the sun for 20-30 minutes if she does not feel like walking. Bedtime at a regular hour--perhaps later than what she has been doing.   Continue the near bedtime gabapentin. Alarm for a regular morning wake time--about 8 hours after bedtime No napping Consider low dose Trazodone down the road if above not helpful.  2.  Dementia:  Move Donepezil to [employer].  Start Memantine 5 mg twice daily. Audiology referral to see if she can get a lower cost hearing aid and Medicaid application (which needs to be denied before she can apply for charity care at [facility] for cataract extraction.  This to help avoid some of her paranoia and isolation.  3.  HM:  Influenza vaccine--Nicky to call regarding the clinic on October 15

## 2019-05-05 NOTE — Progress Notes (Signed)
Done

## 2019-05-28 ENCOUNTER — Encounter: Payer: Self-pay | Admitting: Internal Medicine

## 2019-06-09 ENCOUNTER — Ambulatory Visit (INDEPENDENT_AMBULATORY_CARE_PROVIDER_SITE_OTHER): Payer: Self-pay

## 2019-06-09 ENCOUNTER — Other Ambulatory Visit: Payer: Self-pay

## 2019-06-09 ENCOUNTER — Encounter: Payer: Self-pay | Admitting: Licensed Clinical Social Worker

## 2019-06-09 DIAGNOSIS — Z23 Encounter for immunization: Secondary | ICD-10-CM

## 2019-06-09 NOTE — Progress Notes (Signed)
Patient in for labs with son and daughter.  SWK gave daughter Spanish application for Medicaid, explained to daughter the application needed to be filled out as soon as possible. If the patient did not qualify for Medicaid she may qualify for financial assistance from [facility]. SWK gave daughter business card in case there were any questions.  Daughter acknowledged she understood the information and what she needed to do.

## 2019-07-05 ENCOUNTER — Encounter: Payer: Self-pay | Admitting: Internal Medicine

## 2019-07-05 ENCOUNTER — Telehealth (INDEPENDENT_AMBULATORY_CARE_PROVIDER_SITE_OTHER): Payer: Self-pay | Admitting: Internal Medicine

## 2019-07-05 VITALS — BP 111/75 | HR 56

## 2019-07-05 DIAGNOSIS — F0391 Unspecified dementia with behavioral disturbance: Secondary | ICD-10-CM

## 2019-07-05 DIAGNOSIS — F0392 Unspecified dementia, unspecified severity, with psychotic disturbance: Secondary | ICD-10-CM

## 2019-07-05 DIAGNOSIS — R1110 Vomiting, unspecified: Secondary | ICD-10-CM | POA: Insufficient documentation

## 2019-07-05 DIAGNOSIS — H9193 Unspecified hearing loss, bilateral: Secondary | ICD-10-CM

## 2019-07-05 DIAGNOSIS — R112 Nausea with vomiting, unspecified: Secondary | ICD-10-CM

## 2019-07-05 DIAGNOSIS — H259 Unspecified age-related cataract: Secondary | ICD-10-CM

## 2019-07-05 DIAGNOSIS — F321 Major depressive disorder, single episode, moderate: Secondary | ICD-10-CM

## 2019-07-05 MED ORDER — OMEPRAZOLE 40 MG PO CPDR: 40.0000 mg | DELAYED_RELEASE_CAPSULE | Freq: Every day | ORAL | 11 refills | Status: AC

## 2019-07-05 NOTE — Progress Notes (Signed)
    Subjective:    Patient ID: Holly Anderson, female   DOB: September 03, 1927, 85 y.o.   MRN: [id number]   HPI   Virtual Visit with [employer] Moved to telephone at end as patient's family lost audio  Son, Holly Anderson, interprets   1.  Dementia:  Have not heard from [facility].  We have contacted them again and are resending the referral. [name], LCSW-A has worked with her daughter regarding application for Medicaid.  They still have not completed.  Son states he will work with her to get this done.  Once she is turned down by Medicaid, we can then apply for [facility] ophthalmology referral to get her cataracts removed and improve vision Now on combination of Donepezil and Memantine 5 mg twice daily for about 2 months now.  Perhaps has slowed the rate of memory loss. Not crying as much.  Does not seem so down as was previous Does have some nausea and vomiting since adding he Memantine, but has been out of Omeprazole as no more refills on last prescription. She tends to have the GI symptoms only after the morning dose which she takes with coffee and a piece of bread.  Does not really have a meal until 11:30 a.m. and last meal is around 6 pm. Still getting up in middle of night for a snack as well.  Current Meds  Medication Sig  . amLODipine (NORVASC) 2.5 MG tablet Take 1 tablet by mouth once daily  . citalopram (CELEXA) 10 MG tablet Take 1 tablet (10 mg total) by mouth daily.  . diclofenac sodium (VOLTAREN) 1 % GEL Apply total of 2 to 4 g to knees and shoulders twice daily for pain  . donepezil (ARICEPT) 10 MG tablet 1 tab by mouth every morning.  . gabapentin (NEURONTIN) 100 MG capsule Please note we were sent a refill request for the wrong dosing  Should be 2 at bedtime cancel previous  . lisinopril-hydrochlorothiazide (ZESTORETIC) 20-25 MG tablet Take 1 tablet by mouth once daily  . memantine (NAMENDA) 5 MG tablet 1 tab by mouth twice daily with food   No Known Allergies   Review of  Systems    Objective:   BP 111/75 (BP Location: Right Arm, Patient Position: Sitting, Cuff Size: Normal)   Pulse (!) 56   LMP 01/05/2019 (LMP Unknown)   Physical Exam  Looks well   Assessment & Plan   1.  Hypertension:  Controlled  2.  Dementia with some behavior/paranoia/depression symptoms:  Strongly urged family once again to get moving on the Medicaid application.  To let clinic know when have completed application in and also when gets denial letter. We will then apply to [facility] ophthalmology for evaluation for surgery to remove cataract. Also, to notify us if they have not heard from [facility] in the next 2 weeks.  Hoping she is a candidate for hearing aid and they can direct family to obtain 1 affordable hearing aid Stressed improving her hearing and vision may decrease some of her behavioral concerns/paranoia. To move first dose of Memantine to her 11:30 meal and the second to the 6 p.m. meal.  3.  Nausea:  May only be due to Memantine without much of a meal.  If continues to have the morning nausea, to fill a new Rx for Omeprazole.

## 2019-10-04 ENCOUNTER — Ambulatory Visit: Payer: Self-pay | Admitting: Internal Medicine

## 2019-10-06 ENCOUNTER — Ambulatory Visit (INDEPENDENT_AMBULATORY_CARE_PROVIDER_SITE_OTHER): Payer: Self-pay | Admitting: Internal Medicine

## 2019-10-06 ENCOUNTER — Encounter: Payer: Self-pay | Admitting: Internal Medicine

## 2019-10-06 ENCOUNTER — Other Ambulatory Visit: Payer: Self-pay

## 2019-10-06 VITALS — BP 124/60 | HR 72 | Resp 12 | Ht <= 58 in | Wt 122.0 lb

## 2019-10-06 DIAGNOSIS — F03918 Unspecified dementia, unspecified severity, with other behavioral disturbance: Secondary | ICD-10-CM

## 2019-10-06 DIAGNOSIS — H9193 Unspecified hearing loss, bilateral: Secondary | ICD-10-CM

## 2019-10-06 DIAGNOSIS — F0391 Unspecified dementia with behavioral disturbance: Secondary | ICD-10-CM

## 2019-10-06 DIAGNOSIS — H259 Unspecified age-related cataract: Secondary | ICD-10-CM

## 2019-10-06 DIAGNOSIS — I10 Essential (primary) hypertension: Secondary | ICD-10-CM

## 2019-10-06 NOTE — Progress Notes (Signed)
    Subjective:    Patient ID: Holly Anderson, female   DOB: 24-Mar-1928, 86 y.o.   MRN: [id number]   HPI   1.  Dementia:  Daughters, who do not accompany her today, stopped both the Aricept and Namenda as they felt both were causing nausea and vomiting.   Thought it was just the namenda causing the symptoms.  They did not call the clinic.  She also had anger issues with the meds. Appetite and sleep are better since stopping the medication.  No move yet to get hold of hearing aids and apply for financial assistance for cataract extraction.  Did get hearing exam, just have not obtained the hearing aid.   Discussed again how her sensory isolation with hearing and vision can make her paranoia and disorientation worse.   We have discussed getting this done for more than a year now and not understanding why the family is not moving on this.  2.  Hypertension:  controlled  Current Meds  Medication Sig  . amLODipine (NORVASC) 2.5 MG tablet Take 1 tablet by mouth once daily  . citalopram (CELEXA) 10 MG tablet Take 1 tablet (10 mg total) by mouth daily.  . diclofenac sodium (VOLTAREN) 1 % GEL Apply total of 2 to 4 g to knees and shoulders twice daily for pain  . gabapentin (NEURONTIN) 100 MG capsule Please note we were sent a refill request for the wrong dosing  Should be 2 at bedtime cancel previous  . lisinopril-hydrochlorothiazide (ZESTORETIC) 20-25 MG tablet Take 1 tablet by mouth once daily  . memantine (NAMENDA) 5 MG tablet 1 tab by mouth twice daily with food  . omeprazole (PRILOSEC) 40 MG capsule Take 1 capsule (40 mg total) by mouth daily.   No Known Allergies   Review of Systems    Objective:   BP 124/60 (BP Location: Left Arm, Patient Position: Sitting, Cuff Size: Normal)   Pulse 72   Resp 12   Ht 4\' 7"  (1.397 m)   Wt 122 lb (55.3 kg)   LMP 01/05/2019 (LMP Unknown)   BMI 28.36 kg/m   Physical Exam  NAD Lungs:  CTA CV:  RRR without murmur or rub.  Radial pulses normal  and equal Abd:  S, + BS LE:  Trace pitting edema to pretib areas bilaterally.   Assessment & Plan   1.  Dementia:  Seems to be doing better overall without Memantine and Donepezil.  Asked that in the future, if they are finding problems with meds, to call first before discontinuing.  Strongly urged them to move on the hearing aids and applying for charity care at [facility] for cataract removal.  Very loving family.  Not clear why they are not following through.  Have had case management working with them previously and did not seem to help either. Sounds like son just needs to call [facility] back and get the application filled out for the one hearing aid.  At one point, he told me they couldn't get it for her as she does not have a SS number, but told nursing he just hadn't finished the application.  2.  Hypertension:  Controlled.  3.  COVID 19 vaccination:  Received first of 2 Moderna vaccines on Monday, March 8 at our drive through clinic.

## 2020-01-17 ENCOUNTER — Other Ambulatory Visit: Payer: Self-pay | Admitting: Internal Medicine

## 2020-01-17 DIAGNOSIS — R208 Other disturbances of skin sensation: Secondary | ICD-10-CM

## 2020-02-04 ENCOUNTER — Other Ambulatory Visit: Payer: Self-pay

## 2020-02-04 ENCOUNTER — Ambulatory Visit: Payer: Self-pay | Admitting: Internal Medicine

## 2020-02-04 ENCOUNTER — Encounter: Payer: Self-pay | Admitting: Internal Medicine

## 2020-02-04 VITALS — BP 112/60 | HR 74 | Resp 12 | Ht <= 58 in | Wt 125.0 lb

## 2020-02-04 DIAGNOSIS — M25561 Pain in right knee: Secondary | ICD-10-CM

## 2020-02-04 DIAGNOSIS — I10 Essential (primary) hypertension: Secondary | ICD-10-CM

## 2020-02-04 DIAGNOSIS — M25562 Pain in left knee: Secondary | ICD-10-CM

## 2020-02-04 DIAGNOSIS — H259 Unspecified age-related cataract: Secondary | ICD-10-CM

## 2020-02-04 DIAGNOSIS — F0392 Unspecified dementia, unspecified severity, with psychotic disturbance: Secondary | ICD-10-CM

## 2020-02-04 DIAGNOSIS — F0391 Unspecified dementia with behavioral disturbance: Secondary | ICD-10-CM

## 2020-02-04 DIAGNOSIS — H269 Unspecified cataract: Secondary | ICD-10-CM

## 2020-02-04 DIAGNOSIS — G8929 Other chronic pain: Secondary | ICD-10-CM

## 2020-02-04 DIAGNOSIS — H9193 Unspecified hearing loss, bilateral: Secondary | ICD-10-CM

## 2020-02-04 MED ORDER — DICLOFENAC SODIUM 1 % EX GEL: CUTANEOUS | 11 refills | Status: AC

## 2020-02-04 NOTE — Patient Instructions (Signed)
Apply for [facility] ophthalmology financial assistance  Call in September for influenza vaccine clinic

## 2020-02-04 NOTE — Progress Notes (Signed)
    Subjective:    Patient ID: Holly Anderson, female   DOB: 13-Jun-1928, 86 y.o.   MRN: [id number]   HPI  Daughter, Holly Anderson, interprets  1.  Hearing:  Did get hearing aids for both ears.  Making a big difference, though background noise is a problem.  2.  Cataracts:  Holly Anderson, who interprets states her brother received a letter that because she is not a citizen, they will not do surgery at [facility].    3.  Dementia:  Stable.  Not clear having hearing aids has helped a lot with her behavior.  She is still getting up at nights regularly.  Sleeps during day sometimes.  She states they are not feeding her, though she eats regularly and has a good diet.  4.  Arthritis:  Needs diclofenac gel refilled.      Current Meds  Medication Sig  . amLODipine (NORVASC) 2.5 MG tablet Take 1 tablet by mouth once daily  . citalopram (CELEXA) 10 MG tablet Take 1 tablet (10 mg total) by mouth daily.  . diclofenac sodium (VOLTAREN) 1 % GEL Apply total of 2 to 4 g to knees and shoulders twice daily for pain  . gabapentin (NEURONTIN) 100 MG capsule TAKE 2 CAPSULES BY MOUTH AT BEDTIME  . lisinopril-hydrochlorothiazide (ZESTORETIC) 20-25 MG tablet Take 1 tablet by mouth once daily  . omeprazole (PRILOSEC) 40 MG capsule Take 1 capsule (40 mg total) by mouth daily.   No Known Allergies   Review of Systems    Objective:   BP 112/60 (BP Location: Left Arm, Patient Position: Sitting, Cuff Size: Normal)   Pulse 74   Resp 12   Ht 4\' 7"  (1.397 m)   Wt 125 lb (56.7 kg)   LMP 01/05/2019 (LMP Unknown)   BMI 29.05 kg/m   Physical Exam  NAD Seems more lively today  Lungs:  CTA CV:  RRR without murmur or rub.  Radial and DP pulses normal and equal Abd:  S, NT, No HSM or mass, + BS LE:  No edema.  Hypertrophic changes of knees.   Assessment & Plan   1.  Loss of hearing:  Much improved interaction with hearing aids.  2.  Cataracts:  Told as she is not a citizen per 03/07/2019 that she cannot get financial  assistance at [facility] Checking into [facility] ophthalmology They will bring in letter of denial  3.  Dementia:  No significant change with hearing.  4  Knee arthritis:  Diclofenac gel  Influenza vaccin in September

## 2020-02-29 ENCOUNTER — Telehealth: Payer: Self-pay

## 2020-02-29 NOTE — Telephone Encounter (Signed)
Please notify patient daughter. Patient scheduled for ophthalmology with [facility] on 06/14/2020 @ 2 pm. This is the first opening they have with the doctor that does cataract removal. Patient will need to pay $100 upfront at the time of the appointment. She can then discuss financial assistance with them at the day of her appointment.  Patient is seeing Dr. Valere Dross with [facility] @ [address] [place], [place] [address]. Phone number is [phone].

## 2020-02-29 NOTE — Telephone Encounter (Signed)
Spoke with Holly Anderson patient's daughter and provided information in detailed. Information like address, date and time, Dr.'s name, phone number were spelled word by word in spanish. Also informed regarding $100 dollar upfront and discuss financial assistance with them at the day of the appointment. Verbalized understanding.

## 2020-03-09 ENCOUNTER — Other Ambulatory Visit: Payer: Self-pay | Admitting: Internal Medicine

## 2020-03-09 DIAGNOSIS — I10 Essential (primary) hypertension: Secondary | ICD-10-CM

## 2020-05-24 ENCOUNTER — Other Ambulatory Visit: Payer: Self-pay | Admitting: Internal Medicine

## 2020-05-25 NOTE — Telephone Encounter (Signed)
Please call and find out what they are doing with Donepezil and Memantine.  I discontinued them in March as the family told me she did better without them and had stopped on their own.

## 2020-06-01 NOTE — Telephone Encounter (Signed)
Spoke with Kelby Fam patient's son who stated his sister Angelica is the one who takes care of his mom medications. Called Angelica's number left vm to call back clinic to get clarification.  Called Kelby Fam back and informed unable to get on hold with Angelica and to inform her if can please return call.  Manuel agreed.

## 2020-07-26 ENCOUNTER — Other Ambulatory Visit: Payer: Self-pay | Admitting: Internal Medicine

## 2020-08-07 ENCOUNTER — Other Ambulatory Visit: Payer: Self-pay | Admitting: Internal Medicine

## 2020-08-07 ENCOUNTER — Other Ambulatory Visit: Payer: Self-pay

## 2020-08-07 DIAGNOSIS — R7303 Prediabetes: Secondary | ICD-10-CM

## 2020-08-07 DIAGNOSIS — I10 Essential (primary) hypertension: Secondary | ICD-10-CM

## 2020-08-07 NOTE — Progress Notes (Signed)
Here for CBC, CMP, A1C Moderna booster also given

## 2020-08-08 ENCOUNTER — Other Ambulatory Visit: Payer: Self-pay

## 2020-08-08 LAB — COMPREHENSIVE METABOLIC PANEL
ALT: 15 IU/L (ref 0–32)
AST: 32 IU/L (ref 0–40)
Albumin/Globulin Ratio: 1.4 (ref 1.2–2.2)
Albumin: 4.2 g/dL (ref 3.5–4.6)
Alkaline Phosphatase: 108 IU/L (ref 44–121)
BUN/Creatinine Ratio: 31 — ABNORMAL HIGH (ref 12–28)
BUN: 28 mg/dL (ref 10–36)
Bilirubin Total: 0.5 mg/dL (ref 0.0–1.2)
CO2: 28 mmol/L (ref 20–29)
Calcium: 9.3 mg/dL (ref 8.7–10.3)
Chloride: 102 mmol/L (ref 96–106)
Creatinine, Ser: 0.9 mg/dL (ref 0.57–1.00)
GFR calc Af Amer: 65 mL/min/{1.73_m2} (ref >59)
GFR calc non Af Amer: 56 mL/min/{1.73_m2} — ABNORMAL LOW (ref >59)
Globulin, Total: 3.1 g/dL (ref 1.5–4.5)
Glucose: 97 mg/dL (ref 65–99)
Potassium: 4 mmol/L (ref 3.5–5.2)
Sodium: 143 mmol/L (ref 134–144)
Total Protein: 7.3 g/dL (ref 6.0–8.5)

## 2020-08-08 LAB — CBC WITH DIFFERENTIAL/PLATELET
Basophils Absolute: 0 10*3/uL (ref 0.0–0.2)
Basos: 1 %
EOS (ABSOLUTE): 0 10*3/uL (ref 0.0–0.4)
Eos: 1 %
Hematocrit: 41 % (ref 34.0–46.6)
Hemoglobin: 13.7 g/dL (ref 11.1–15.9)
Immature Grans (Abs): 0 10*3/uL (ref 0.0–0.1)
Immature Granulocytes: 0 %
Lymphocytes Absolute: 0.8 10*3/uL (ref 0.7–3.1)
Lymphs: 34 %
MCH: 30.5 pg (ref 26.6–33.0)
MCHC: 33.4 g/dL (ref 31.5–35.7)
MCV: 91 fL (ref 79–97)
Monocytes Absolute: 0.2 10*3/uL (ref 0.1–0.9)
Monocytes: 9 %
Neutrophils Absolute: 1.3 10*3/uL — ABNORMAL LOW (ref 1.4–7.0)
Neutrophils: 55 %
Platelets: 209 10*3/uL (ref 150–450)
RBC: 4.49 x10E6/uL (ref 3.77–5.28)
RDW: 12.7 % (ref 11.7–15.4)
WBC: 2.3 10*3/uL — CL (ref 3.4–10.8)

## 2020-08-08 LAB — HGB A1C W/O EAG: Hgb A1c MFr Bld: 6 % — ABNORMAL HIGH (ref 4.8–5.6)

## 2020-08-10 ENCOUNTER — Ambulatory Visit: Payer: Self-pay | Admitting: Internal Medicine

## 2020-08-17 ENCOUNTER — Other Ambulatory Visit: Payer: Self-pay

## 2020-08-17 ENCOUNTER — Encounter: Payer: Self-pay | Admitting: Internal Medicine

## 2020-08-17 ENCOUNTER — Ambulatory Visit: Payer: Self-pay | Admitting: Internal Medicine

## 2020-08-17 VITALS — BP 98/49 | HR 65 | Resp 12 | Ht <= 58 in | Wt 121.5 lb

## 2020-08-17 DIAGNOSIS — F0391 Unspecified dementia with behavioral disturbance: Secondary | ICD-10-CM

## 2020-08-17 DIAGNOSIS — I10 Essential (primary) hypertension: Secondary | ICD-10-CM

## 2020-08-17 DIAGNOSIS — R7303 Prediabetes: Secondary | ICD-10-CM

## 2020-08-17 DIAGNOSIS — H9193 Unspecified hearing loss, bilateral: Secondary | ICD-10-CM

## 2020-08-17 DIAGNOSIS — H259 Unspecified age-related cataract: Secondary | ICD-10-CM

## 2020-08-17 DIAGNOSIS — F0392 Unspecified dementia, unspecified severity, with psychotic disturbance: Secondary | ICD-10-CM

## 2020-08-17 NOTE — Progress Notes (Signed)
Holly Anderson is DNR

## 2020-08-17 NOTE — Progress Notes (Signed)
Subjective:    Patient ID: Holly Anderson, female   DOB: 04-20-1928, 87 y.o.   MRN: [id number]   HPI   1.  Dementia:  Worsening.  Sleeping more.  Taking the Gabapentin 200 mg, which has helped with her nighttime sleep.  Not wandering around as before.  Goes to sleep around 10-11 p.m. and then up at 6 a.m. when she goes to the bathroom and is up for about 1 hour.  Then back to sleep until 10 to 11 a.m.    Sounds like the family has continued to fill either her Donepezil or Memantine, despite discussion last March 2021 that they felt the meds were causing anger and paranoia and they had stopped both. We had a hard time contacting Dierdre Forth) in October when a request came across to fill Memantine again.  See phone note from October with attempts to contact. We have added daughter, Charlott Rakes, who also lives with Tomasina, to her contact list.  Phone numbers are correct for Anjelica. Rediscussed she is a DNR with both daughters.    2.  Prediabetes:  Eating a lot of sweets and sweetened drinks.  A1C now up to 6.0%.    3.  Low white count:  This is new.  No fevers.  Has been acting well.     4.  Hypertension:  Taking both Lisinopril/HCTZ and Amlodipine regularly.  .  5.  Decreased hearing and cataracts:  Has hearing aides now, but often removes and fights to not wear.  She was evaluated for her cataracts.  Concern that she would mess with the eye if cataract surgery proceeded and cause more problems.  She apparently has good vision out of one eye.  She was unwilling to be examined for a while at Ophthalmology.  Current Meds  Medication Sig  . amLODipine (NORVASC) 2.5 MG tablet Take 1 tablet by mouth once daily  . diclofenac Sodium (VOLTAREN) 1 % GEL Use 2-4g 4 times daily to joints as needed for pain  . gabapentin (NEURONTIN) 100 MG capsule TAKE 2 CAPSULES BY MOUTH AT BEDTIME  . lisinopril-hydrochlorothiazide (ZESTORETIC) 20-25 MG tablet Take 1 tablet by mouth once daily  . omeprazole  (PRILOSEC) 40 MG capsule Take 1 capsule (40 mg total) by mouth daily.   No Known Allergies   Review of Systems    Objective:   BP (!) 98/49 (BP Location: Left Arm, Patient Position: Sitting, Cuff Size: Normal)   Pulse 65   Resp 12   Ht 4\' 7"  (1.397 m)   Wt 121 lb 8 oz (55.1 kg)   LMP 01/05/2019 (LMP Unknown)   BMI 28.24 kg/m   Physical Exam  NAD Smiling one minute and consternated the next.  Chats away during history taking and exam Lungs:  CTA CV:  RRR without murmur or rub.  Radial and DP pulses normal and equal LE:  No edema.   Assessment & Plan   1.  Hypertension:  Low normal, so will hold amlodipine for now.  Recheck bp in 1 month  2.  Low WBC/Neutrophils:  Recheck in 1 month.  Difficult to draw blood  3.  Dementia:  Received history of med fills past 3 months.  Family finally states she has not been on either med for memory since October--not clear how this was not understood previously. Apparently, they do have an old bottle of whichever medicine at home and will call it in when they return home  4.  Prediabetes:  Discussed giving  her one treat daily, but avoid sugary drinks as she is moving closer to diabetic range with sugar levels.    5.  DNR:  revisted this again today.  We do not have a DNR document in her chart, but both sisters agree she should be DNR.  Discussed we would still treat her chronic health issues and work to keep her healthy to avoid, but would not perform screenings for malignancies as would not treat.  We will also continue to treat for acute reversible health issues, such as UTIs, pneumonias, etc at this point.

## 2020-09-25 ENCOUNTER — Other Ambulatory Visit: Payer: Self-pay | Admitting: Internal Medicine

## 2020-09-25 ENCOUNTER — Other Ambulatory Visit: Payer: Self-pay

## 2020-09-25 VITALS — BP 120/78

## 2020-09-25 DIAGNOSIS — I10 Essential (primary) hypertension: Secondary | ICD-10-CM

## 2020-09-25 DIAGNOSIS — F0391 Unspecified dementia with behavioral disturbance: Secondary | ICD-10-CM

## 2020-09-25 DIAGNOSIS — F03918 Unspecified dementia, unspecified severity, with other behavioral disturbance: Secondary | ICD-10-CM

## 2020-09-25 DIAGNOSIS — D72819 Decreased white blood cell count, unspecified: Secondary | ICD-10-CM

## 2020-09-25 NOTE — Progress Notes (Signed)
Has been off amlodipine since 1/20 as her BP was running a bit low.    Decided to forgo CBC today as patient irritable.   BP is fine off amlodipine

## 2020-11-24 ENCOUNTER — Other Ambulatory Visit: Payer: Self-pay | Admitting: Internal Medicine

## 2020-11-24 DIAGNOSIS — R208 Other disturbances of skin sensation: Secondary | ICD-10-CM

## 2020-11-24 NOTE — Telephone Encounter (Signed)
Rx requested via interface from pharmacy-  Pt's daughter also called stating she was unable to pick up Rx for patient.  I called pharmacy and confirmed patient is out of refills- last refill pick up was 10/19/2020

## 2020-12-27 ENCOUNTER — Ambulatory Visit: Payer: Self-pay | Admitting: Internal Medicine

## 2021-02-16 ENCOUNTER — Other Ambulatory Visit: Payer: Self-pay | Admitting: Internal Medicine

## 2021-02-16 DIAGNOSIS — I10 Essential (primary) hypertension: Secondary | ICD-10-CM

## 2021-03-12 ENCOUNTER — Other Ambulatory Visit: Payer: Self-pay | Admitting: Internal Medicine

## 2021-03-12 DIAGNOSIS — I10 Essential (primary) hypertension: Secondary | ICD-10-CM

## 2021-03-19 ENCOUNTER — Telehealth: Payer: Self-pay

## 2021-03-19 NOTE — Telephone Encounter (Signed)
Pt call for Amlodipine refill. Notified that Dr Delrae Alfred took her off that medication during her last visit

## 2021-06-04 ENCOUNTER — Telehealth: Payer: Self-pay

## 2021-06-04 NOTE — Telephone Encounter (Signed)
Pt son Saharra Santo called asking for Dr. Delrae Alfred to give him a call. He did not share any details on what he wanted to discuss.

## 2021-06-04 NOTE — Telephone Encounter (Signed)
Sounds like needs [phone] form filled out for his mother for citizenship--he will contact attorney and confirm and they will let our office know.  Happy to fill out.

## 2021-08-14 ENCOUNTER — Telehealth: Payer: Self-pay

## 2021-08-14 NOTE — Telephone Encounter (Signed)
Betrice's lawyer office called asking to speak to Dr Delrae Alfred. Patient previously informed dr Delrae Alfred that about getting DNR paperwork. Lawyer is Wandra Feinstein [phone]

## 2021-10-16 ENCOUNTER — Other Ambulatory Visit: Payer: Self-pay | Admitting: Internal Medicine

## 2021-10-16 DIAGNOSIS — R208 Other disturbances of skin sensation: Secondary | ICD-10-CM

## 2021-11-13 NOTE — Telephone Encounter (Signed)
Spoke with lawyers office 11/09/21 and agreed that I would check in with Dr Delrae Alfred to set up a meeting to discuss what they would need from [place] to move forward with immigration plans.  ? ?I left a message with reception to get a call back from their office to set a date and time for a meeting 11/12/21 and was told I would get a call back. ? ?I attempted to them again and was unable to leave a voicemail. 11/13/21 ?

## 2021-11-30 ENCOUNTER — Encounter: Payer: Self-pay | Admitting: Internal Medicine

## 2021-11-30 ENCOUNTER — Ambulatory Visit: Payer: Self-pay | Admitting: Internal Medicine

## 2021-11-30 VITALS — BP 84/46 | HR 64 | Resp 16

## 2021-11-30 DIAGNOSIS — F0392 Unspecified dementia, unspecified severity, with psychotic disturbance: Secondary | ICD-10-CM

## 2021-11-30 DIAGNOSIS — H259 Unspecified age-related cataract: Secondary | ICD-10-CM

## 2021-11-30 DIAGNOSIS — I10 Essential (primary) hypertension: Secondary | ICD-10-CM

## 2021-11-30 DIAGNOSIS — K029 Dental caries, unspecified: Secondary | ICD-10-CM | POA: Insufficient documentation

## 2021-11-30 DIAGNOSIS — H269 Unspecified cataract: Secondary | ICD-10-CM

## 2021-11-30 DIAGNOSIS — H9193 Unspecified hearing loss, bilateral: Secondary | ICD-10-CM

## 2021-11-30 DIAGNOSIS — R7303 Prediabetes: Secondary | ICD-10-CM

## 2021-11-30 DIAGNOSIS — F03918 Unspecified dementia, unspecified severity, with other behavioral disturbance: Secondary | ICD-10-CM

## 2021-11-30 DIAGNOSIS — Z79899 Other long term (current) drug therapy: Secondary | ICD-10-CM

## 2021-11-30 NOTE — Progress Notes (Signed)
    Subjective:    Patient ID: Holly Anderson, female   DOB: 1928-03-02, 88 y.o.   MRN: [id number]   HPI  Here with her 3 daughters. Spoke with Vicente Males, attorney family is working with for legal status/green card.  They do not need an N-648 form filled out, but an [id number] from a certified physician, which I am not.    The attorney will find another provider who is certified to take care of this.   Hypertension:  confused about Amlodipine.  Clarified she is not taking.  Discontinued more than 1 year ago.  She is taking Lisinopril/HCTZ 20-25 daily.  Did have LE edema, but that is gone.  2.  Dementia:  sleeps well with gabapentin and her paranoia is much better then.  She is happier--discusses life as if she is a little group.  She has a hearing aide and vision is fine out of one eye.  Family at times feel recommendations for interventions is not done as she is Hispanic and has no insurance.  Recognizes daughters at times.    3.  Dental:  Discussed having teeth pulled upper, to get dentures.  She is eating well.  Dental clinic they took her to stated they would not proceed.  She is not having any pain.    Current Meds  Medication Sig   diclofenac Sodium (VOLTAREN) 1 % GEL Use 2-4g 4 times daily to joints as needed for pain   gabapentin (NEURONTIN) 100 MG capsule TAKE 2 CAPSULES BY MOUTH AT BEDTIME   lisinopril-hydrochlorothiazide (ZESTORETIC) 20-25 MG tablet Take 1 tablet by mouth once daily   [DISCONTINUED] amLODipine (NORVASC) 2.5 MG tablet Take 1 tablet by mouth once daily      No Known Allergies   Review of Systems    Objective:   BP (!) 84/46 (BP Location: Right Arm, Patient Position: Sitting, Cuff Size: Normal)   Pulse 64   Resp 16   LMP 01/05/2019 (LMP Unknown)   Physical Exam NAD HEENT:  PERRL, EOMI, dense cataract of left eye. 1 decayed piece of tooth noted in upper right jaw Neck:  Supple, No adenopathy Chest:  CTA CV:  RRR with radial and DP pulses normal and  equal LE:  No edema.    Assessment & Plan    Hypertension:  a bit low today, but appears fine.  To bring in or appropriately throw out amlodipine.  Will see where BP goes with just the Lisinopril/HCTZ at half dose (10/12.5 mg daily)  CBC, CMP  2.  Dementia:  Behavior and sleep relatively well controlled.  Have discussed at length previously that she is DNR status.    3.  Prediabetes:  A1C, though discussed as long as she feels well, okay to allow for occasional sweets/soda.    4.  Cataracts:  family has chosen not to pursue--would not be still for exam last year.    5.  Decreased hearing:  has hearing aides when she allows to leave in.  6.  Dental decay:  eating fine without pain.  Family will notify if needs to be addressed.

## 2021-12-01 LAB — CBC WITH DIFFERENTIAL/PLATELET
Basophils Absolute: 0 10*3/uL (ref 0.0–0.2)
Basos: 1 %
EOS (ABSOLUTE): 0 10*3/uL (ref 0.0–0.4)
Eos: 1 %
Hematocrit: 35 % (ref 34.0–46.6)
Hemoglobin: 11.7 g/dL (ref 11.1–15.9)
Immature Grans (Abs): 0 10*3/uL (ref 0.0–0.1)
Immature Granulocytes: 0 %
Lymphocytes Absolute: 1.1 10*3/uL (ref 0.7–3.1)
Lymphs: 27 %
MCH: 30.5 pg (ref 26.6–33.0)
MCHC: 33.4 g/dL (ref 31.5–35.7)
MCV: 91 fL (ref 79–97)
Monocytes Absolute: 0.3 10*3/uL (ref 0.1–0.9)
Monocytes: 7 %
Neutrophils Absolute: 2.7 10*3/uL (ref 1.4–7.0)
Neutrophils: 64 %
Platelets: 187 10*3/uL (ref 150–450)
RBC: 3.84 x10E6/uL (ref 3.77–5.28)
RDW: 13.5 % (ref 11.7–15.4)
WBC: 4.3 10*3/uL (ref 3.4–10.8)

## 2021-12-01 LAB — COMPREHENSIVE METABOLIC PANEL
ALT: 11 IU/L (ref 0–32)
AST: 25 IU/L (ref 0–40)
Albumin/Globulin Ratio: 1.3 (ref 1.2–2.2)
Albumin: 3.8 g/dL (ref 3.5–4.6)
Alkaline Phosphatase: 86 IU/L (ref 44–121)
BUN/Creatinine Ratio: 30 — ABNORMAL HIGH (ref 12–28)
BUN: 36 mg/dL (ref 10–36)
Bilirubin Total: 0.5 mg/dL (ref 0.0–1.2)
CO2: 22 mmol/L (ref 20–29)
Calcium: 9.4 mg/dL (ref 8.7–10.3)
Chloride: 107 mmol/L — ABNORMAL HIGH (ref 96–106)
Creatinine, Ser: 1.19 mg/dL — ABNORMAL HIGH (ref 0.57–1.00)
Globulin, Total: 3 g/dL (ref 1.5–4.5)
Glucose: 109 mg/dL — ABNORMAL HIGH (ref 70–99)
Potassium: 4.2 mmol/L (ref 3.5–5.2)
Sodium: 146 mmol/L — ABNORMAL HIGH (ref 134–144)
Total Protein: 6.8 g/dL (ref 6.0–8.5)
eGFR: 43 mL/min/{1.73_m2} — ABNORMAL LOW (ref >59)

## 2021-12-01 LAB — HGB A1C W/O EAG: Hgb A1c MFr Bld: 5.4 % (ref 4.8–5.6)

## 2021-12-04 NOTE — Telephone Encounter (Signed)
Seen by Dr mulberry °

## 2022-04-30 ENCOUNTER — Telehealth: Payer: Self-pay

## 2022-04-30 NOTE — Telephone Encounter (Signed)
Patients daughter called to report left foot heel blister for about a month. Reoccurring. No odor, does not look infected. Has bleed when it opens up. Has tried nystatin which helps some, but continues to reoccur. Some complaints of pain. Daughter would like to know if Dr Amil Amen can do a home visit since patient is no longer moving, especially now with the blister. Patients dementia has also progressed which makes it difficult to take her out of the house.

## 2022-05-07 ENCOUNTER — Encounter: Payer: Self-pay | Admitting: Internal Medicine

## 2022-05-07 ENCOUNTER — Other Ambulatory Visit (INDEPENDENT_AMBULATORY_CARE_PROVIDER_SITE_OTHER): Payer: Self-pay | Admitting: Internal Medicine

## 2022-05-07 VITALS — BP 120/70 | HR 96 | Resp 20

## 2022-05-07 DIAGNOSIS — Z66 Do not resuscitate: Secondary | ICD-10-CM

## 2022-05-07 DIAGNOSIS — R238 Other skin changes: Secondary | ICD-10-CM

## 2022-05-07 DIAGNOSIS — Z515 Encounter for palliative care: Secondary | ICD-10-CM | POA: Insufficient documentation

## 2022-05-07 DIAGNOSIS — F02C11 Dementia in other diseases classified elsewhere, severe, with agitation: Secondary | ICD-10-CM

## 2022-05-07 DIAGNOSIS — Z7189 Other specified counseling: Secondary | ICD-10-CM

## 2022-05-07 DIAGNOSIS — G301 Alzheimer's disease with late onset: Secondary | ICD-10-CM

## 2022-05-07 NOTE — Progress Notes (Signed)
Subjective:    Patient ID: Holly Anderson, female   DOB: Dec 22, 1927, 88 y.o.   MRN: [id number]   HPI  Home visit with five of Liam's adult children.  I believe there is one son who recently went back to [place] after visiting and also has 2 other sons who live in [place] and have not been able to see her for 20 years.    Holly Anderson has had severe dementia with significant decline in recent years and apparently, over past 3 months has rapidly declined.   Not clear if has been in past month or past week as her children discuss that she has not recognized them.  She has not been out of bed for at least a week for a shower or to use the bathroom. They have tried to cushion her heels, lower leg and sacral area and are changing dressings regularly, but she has developed blistering in areas of pressure and breakdown of skin.   Little po intake of water/food.  Urine output has been decreased. We have discussed in past that she be made DNR status.   Discussed again at length today as well as how aggressive to treat.    Current Meds  Medication Sig   lisinopril-hydrochlorothiazide (ZESTORETIC) 20-25 MG tablet Take 1 tablet by mouth once daily   No Known Allergies   Review of Systems    Objective:   BP 120/70 (BP Location: Right Arm, Patient Position: Supine, Cuff Size: Normal)   Pulse 96   Resp 20   LMP 01/05/2019 (LMP Unknown)   Physical Exam Lying in bed. HEENT:  mumbling in Spanish and cries out in discomfort when attempt to move.  Family states what she is saying does not make sense.  She does open her eyes at time and has some white sticky discharge about the lids.  Conjunctivae without injection.  Mouth/lips appear dry. Lungs:  Sound CTA CV:  RRR  Abd:  S, No obvious tenderness with palpation.  No HSM or mass. Skin:  dry with poor turgor. She has fluid filled vesicle without pustular contents on sacral/coccygeal area with breakdown of skin.  Appears not to go beyond dermis.   She also has some vesicles down lateral aspects of bilateral lower legs, also on heels with bogginess.  Toes with blackened areas.     Assessment & Plan    Dementia, end stage and end of life care:  family very interested in comfort care.  We discussed DNR status and all agree they do not want their mom to be put through the pain of CPR.  After returning to office, will fill out form and get that back to them.   Referral to hospice for comfort care. Have asked family to obtain Glycerin oral sponge swabs to clean their mom's mouth and give her mouth and lips some moisture.  Straws for drinking water/liquids. Warm water with a drop or two of baby shampoo to gently cleanse eyes twice daily. Bathe feet gently and massage with moisturizing lotion, avoiding pressure wounds. Apply Bacitracin ointment for now to dressings so they do not dry to the wounds and cause pain if need to be removed.   To continue with cushions for legs and ankles to keep areas of breakdown off the bed  2.  Skin breakdown/pressure ulcers/blistering.  Perhaps having embolic phenomenon to toes as well or just decreased perfusion to toes/feet.  Will ask hospice/[facility] to see about charity care for [name] as she is uninsured.  Would like for them to care for her skin and provide care to reduce pressure to areas.  3.  Hypertension:  may stop Lisinopril/HCTZ at this point.

## 2022-05-07 NOTE — Telephone Encounter (Signed)
Patient has been scheduled

## 2022-06-07 ENCOUNTER — Other Ambulatory Visit: Payer: Self-pay | Admitting: Internal Medicine

## 2022-06-07 ENCOUNTER — Ambulatory Visit: Payer: Self-pay | Admitting: Internal Medicine

## 2022-07-31 ENCOUNTER — Telehealth: Payer: Self-pay | Admitting: Internal Medicine

## 2022-08-01 ENCOUNTER — Ambulatory Visit: Payer: Self-pay | Admitting: Internal Medicine

## 2022-08-01 NOTE — Telephone Encounter (Signed)
Notified, letter sent out to scan

## 2022-11-15 ENCOUNTER — Telehealth: Payer: Self-pay | Admitting: Internal Medicine

## 2023-03-11 ENCOUNTER — Telehealth: Payer: Self-pay | Admitting: Internal Medicine

## 2023-03-11 NOTE — Telephone Encounter (Signed)
Called Patient's son to ask about a notice we got from [facility] stating, they could not see patient because they wasn't answering, Patient's son Holly Anderson stated that he had call manager of care team and asked if they could go visit after 12 noon because that is when patient wakes up. Son said manager was going to inform care team and after that they have been going after 12. He stated that it was two weeks ago when they went to visit around 10am and no one answer, but after he called care team knows now and makes visits after 12.  sister who lives with patient works and gets home at [employer].

## 2024-01-09 ENCOUNTER — Telehealth: Payer: Self-pay

## 2024-01-09 NOTE — Telephone Encounter (Signed)
 Per Doctor [facility] patient needs a home visit.   -contacted patient , no answer lvm asking to return call.

## 2024-01-09 NOTE — Telephone Encounter (Signed)
 Patient's daughter called back and states that patient can get an appointment any time after 12 noon, or weekend. Daughter states there is always somebody with her after 12 noon.   Patient will be on wait list for an appointment .

## 2024-01-16 NOTE — Telephone Encounter (Signed)
 Patient's daughter call today and stated that she wanted to let Dr. Jayne Mews know that family was notified today by the nurse that usually visits patient that patient might be near the end of life.   Daughter wanted Doctor Jayne Mews to know if doctor would like to go visit patient soon.
